# Patient Record
Sex: Male | Born: 1945
Health system: Southern US, Community
[De-identification: ages and names within clinical notes are randomized; demographics above are authoritative.]

## PROBLEM LIST (undated history)

## (undated) DIAGNOSIS — E7800 Pure hypercholesterolemia, unspecified: Secondary | ICD-10-CM

## (undated) DIAGNOSIS — I10 Essential (primary) hypertension: Secondary | ICD-10-CM

## (undated) DIAGNOSIS — J449 Chronic obstructive pulmonary disease, unspecified: Secondary | ICD-10-CM

## (undated) DIAGNOSIS — I509 Heart failure, unspecified: Secondary | ICD-10-CM

## (undated) DIAGNOSIS — E119 Type 2 diabetes mellitus without complications: Secondary | ICD-10-CM

## (undated) HISTORY — PX: SPLENECTOMY: SUR1306

---

## 2000-09-14 ENCOUNTER — Emergency Department (HOSPITAL_COMMUNITY): Admission: EM | Admit: 2000-09-14 | Discharge: 2000-09-14 | Payer: Self-pay | Admitting: Emergency Medicine

## 2000-11-09 ENCOUNTER — Emergency Department (HOSPITAL_COMMUNITY): Admission: EM | Admit: 2000-11-09 | Discharge: 2000-11-09 | Payer: Self-pay | Admitting: Emergency Medicine

## 2000-11-16 ENCOUNTER — Encounter: Payer: Self-pay | Admitting: Urology

## 2000-11-16 ENCOUNTER — Inpatient Hospital Stay (HOSPITAL_COMMUNITY): Admission: AD | Admit: 2000-11-16 | Discharge: 2000-11-21 | Payer: Self-pay | Admitting: Urology

## 2015-12-17 DEATH — deceased

## 2018-05-08 DIAGNOSIS — E7849 Other hyperlipidemia: Secondary | ICD-10-CM | POA: Diagnosis present

## 2018-05-31 ENCOUNTER — Emergency Department (HOSPITAL_COMMUNITY): Payer: Medicare Other

## 2018-05-31 ENCOUNTER — Encounter (HOSPITAL_COMMUNITY): Payer: Self-pay | Admitting: Emergency Medicine

## 2018-05-31 ENCOUNTER — Inpatient Hospital Stay (HOSPITAL_COMMUNITY)
Admission: EM | Admit: 2018-05-31 | Discharge: 2018-06-03 | DRG: 871 | Disposition: A | Discharge status: Home Health | Payer: Medicare Other | Attending: Internal Medicine | Admitting: Internal Medicine

## 2018-05-31 ENCOUNTER — Other Ambulatory Visit: Payer: Self-pay

## 2018-05-31 DIAGNOSIS — Z87891 Personal history of nicotine dependence: Secondary | ICD-10-CM

## 2018-05-31 DIAGNOSIS — E119 Type 2 diabetes mellitus without complications: Secondary | ICD-10-CM | POA: Diagnosis present

## 2018-05-31 DIAGNOSIS — J189 Pneumonia, unspecified organism: Secondary | ICD-10-CM | POA: Diagnosis present

## 2018-05-31 DIAGNOSIS — T25021D Burn of unspecified degree of right foot, subsequent encounter: Secondary | ICD-10-CM

## 2018-05-31 DIAGNOSIS — E669 Obesity, unspecified: Secondary | ICD-10-CM | POA: Diagnosis present

## 2018-05-31 DIAGNOSIS — E1169 Type 2 diabetes mellitus with other specified complication: Secondary | ICD-10-CM | POA: Diagnosis present

## 2018-05-31 DIAGNOSIS — R008 Other abnormalities of heart beat: Secondary | ICD-10-CM | POA: Diagnosis present

## 2018-05-31 DIAGNOSIS — S80212A Abrasion, left knee, initial encounter: Secondary | ICD-10-CM | POA: Diagnosis present

## 2018-05-31 DIAGNOSIS — E1165 Type 2 diabetes mellitus with hyperglycemia: Secondary | ICD-10-CM | POA: Diagnosis present

## 2018-05-31 DIAGNOSIS — Z9081 Acquired absence of spleen: Secondary | ICD-10-CM

## 2018-05-31 DIAGNOSIS — A419 Sepsis, unspecified organism: Principal | ICD-10-CM | POA: Diagnosis present

## 2018-05-31 DIAGNOSIS — E7800 Pure hypercholesterolemia, unspecified: Secondary | ICD-10-CM | POA: Diagnosis present

## 2018-05-31 DIAGNOSIS — T25031D Burn of unspecified degree of right toe(s) (nail), subsequent encounter: Secondary | ICD-10-CM

## 2018-05-31 DIAGNOSIS — S0083XA Contusion of other part of head, initial encounter: Secondary | ICD-10-CM | POA: Diagnosis present

## 2018-05-31 DIAGNOSIS — L8990 Pressure ulcer of unspecified site, unspecified stage: Secondary | ICD-10-CM | POA: Diagnosis present

## 2018-05-31 DIAGNOSIS — Z79899 Other long term (current) drug therapy: Secondary | ICD-10-CM

## 2018-05-31 DIAGNOSIS — S80211A Abrasion, right knee, initial encounter: Secondary | ICD-10-CM | POA: Diagnosis present

## 2018-05-31 DIAGNOSIS — E785 Hyperlipidemia, unspecified: Secondary | ICD-10-CM | POA: Diagnosis present

## 2018-05-31 DIAGNOSIS — X12XXXD Contact with other hot fluids, subsequent encounter: Secondary | ICD-10-CM | POA: Diagnosis present

## 2018-05-31 DIAGNOSIS — Z794 Long term (current) use of insulin: Secondary | ICD-10-CM

## 2018-05-31 DIAGNOSIS — T25022D Burn of unspecified degree of left foot, subsequent encounter: Secondary | ICD-10-CM

## 2018-05-31 DIAGNOSIS — I2510 Atherosclerotic heart disease of native coronary artery without angina pectoris: Secondary | ICD-10-CM | POA: Diagnosis present

## 2018-05-31 DIAGNOSIS — Z6836 Body mass index (BMI) 36.0-36.9, adult: Secondary | ICD-10-CM

## 2018-05-31 DIAGNOSIS — Y92013 Bedroom of single-family (private) house as the place of occurrence of the external cause: Secondary | ICD-10-CM

## 2018-05-31 DIAGNOSIS — Y9389 Activity, other specified: Secondary | ICD-10-CM

## 2018-05-31 DIAGNOSIS — I10 Essential (primary) hypertension: Secondary | ICD-10-CM | POA: Diagnosis present

## 2018-05-31 DIAGNOSIS — Z7982 Long term (current) use of aspirin: Secondary | ICD-10-CM

## 2018-05-31 DIAGNOSIS — W0110XA Fall on same level from slipping, tripping and stumbling with subsequent striking against unspecified object, initial encounter: Secondary | ICD-10-CM | POA: Diagnosis present

## 2018-05-31 HISTORY — DX: Pure hypercholesterolemia, unspecified: E78.00

## 2018-05-31 HISTORY — DX: Essential (primary) hypertension: I10

## 2018-05-31 HISTORY — DX: Type 2 diabetes mellitus without complications: E11.9

## 2018-05-31 LAB — TROPONIN I: Troponin I: <0.03 ng/mL (ref <0.03)

## 2018-05-31 LAB — MAGNESIUM: Magnesium: 1.5 mg/dL — ABNORMAL LOW (ref 1.7–2.4)

## 2018-05-31 LAB — COMPREHENSIVE METABOLIC PANEL
ALT: 15 U/L (ref 0–44)
AST: 16 U/L (ref 15–41)
Albumin: 3.3 g/dL — ABNORMAL LOW (ref 3.5–5.0)
Alkaline Phosphatase: 51 U/L (ref 38–126)
Anion gap: 12 (ref 5–15)
BUN: 10 mg/dL (ref 8–23)
CO2: 23 mmol/L (ref 22–32)
Calcium: 8.6 mg/dL — ABNORMAL LOW (ref 8.9–10.3)
Chloride: 101 mmol/L (ref 98–111)
Creatinine, Ser: 0.85 mg/dL (ref 0.61–1.24)
GFR calc Af Amer: >60 mL/min (ref >60)
GFR calc non Af Amer: >60 mL/min (ref >60)
Glucose, Bld: 188 mg/dL — ABNORMAL HIGH (ref 70–99)
Potassium: 4.3 mmol/L (ref 3.5–5.1)
Sodium: 136 mmol/L (ref 135–145)
Total Bilirubin: 0.9 mg/dL (ref 0.3–1.2)
Total Protein: 5.7 g/dL — ABNORMAL LOW (ref 6.5–8.1)

## 2018-05-31 LAB — CBC WITH DIFFERENTIAL/PLATELET
Abs Immature Granulocytes: 0 10*3/uL (ref 0.00–0.07)
Basophils Absolute: 0.3 10*3/uL — ABNORMAL HIGH (ref 0.0–0.1)
Basophils Relative: 1 %
Eosinophils Absolute: 0.3 10*3/uL (ref 0.0–0.5)
Eosinophils Relative: 1 %
HCT: 46.1 % (ref 39.0–52.0)
Hemoglobin: 15.4 g/dL (ref 13.0–17.0)
Lymphocytes Relative: 2 %
Lymphs Abs: 0.6 10*3/uL — ABNORMAL LOW (ref 0.7–4.0)
MCH: 31 pg (ref 26.0–34.0)
MCHC: 33.4 g/dL (ref 30.0–36.0)
MCV: 92.9 fL (ref 80.0–100.0)
Monocytes Absolute: 2.9 10*3/uL — ABNORMAL HIGH (ref 0.1–1.0)
Monocytes Relative: 10 %
Neutro Abs: 24.5 10*3/uL — ABNORMAL HIGH (ref 1.7–7.7)
Neutrophils Relative %: 86 %
Platelets: 351 10*3/uL (ref 150–400)
RBC: 4.96 MIL/uL (ref 4.22–5.81)
RDW: 13.7 % (ref 11.5–15.5)
WBC: 28.5 10*3/uL — ABNORMAL HIGH (ref 4.0–10.5)
nRBC: 0 % (ref 0.0–0.2)
nRBC: 0 /100 WBC

## 2018-05-31 LAB — TSH: TSH: 0.965 u[IU]/mL (ref 0.350–4.500)

## 2018-05-31 LAB — PHOSPHORUS: Phosphorus: 2.5 mg/dL (ref 2.5–4.6)

## 2018-05-31 LAB — LACTIC ACID, PLASMA: Lactic Acid, Venous: 2.6 mmol/L (ref 0.5–1.9)

## 2018-05-31 MED ORDER — SODIUM CHLORIDE 0.9 % IV BOLUS: 1000.0000 mL | Freq: Once | INTRAVENOUS | Status: DC

## 2018-05-31 MED ORDER — SODIUM CHLORIDE 0.9 % IV SOLN: 2.0000 g | Freq: Two times a day (BID) | INTRAVENOUS | Status: DC

## 2018-05-31 MED ORDER — VANCOMYCIN HCL 10 G IV SOLR
2000.0000 mg | Freq: Once | INTRAVENOUS | Status: AC
  Administered 2018-06-01: 2000 mg via INTRAVENOUS
  Filled 2018-05-31: qty 2000

## 2018-05-31 MED ORDER — SODIUM CHLORIDE 0.9 % IV SOLN: 1.0000 g | Freq: Once | INTRAVENOUS | Status: DC

## 2018-05-31 MED ORDER — VANCOMYCIN HCL 10 G IV SOLR: 1250.0000 mg | Freq: Once | INTRAVENOUS | Status: DC

## 2018-05-31 MED ORDER — SODIUM CHLORIDE 0.9 % IV BOLUS
500.0000 mL | Freq: Once | INTRAVENOUS | Status: AC
  Administered 2018-05-31: 1000 mL via INTRAVENOUS

## 2018-05-31 MED ORDER — SODIUM CHLORIDE 0.9 % IV BOLUS
2000.0000 mL | Freq: Once | INTRAVENOUS | Status: AC
  Administered 2018-05-31: 2000 mL via INTRAVENOUS

## 2018-05-31 MED ORDER — VANCOMYCIN HCL IN DEXTROSE 1-5 GM/200ML-% IV SOLN: 1000.0000 mg | Freq: Two times a day (BID) | INTRAVENOUS | Status: DC

## 2018-05-31 MED ORDER — SODIUM CHLORIDE 0.9 % IV SOLN
2.0000 g | Freq: Once | INTRAVENOUS | Status: AC
  Administered 2018-05-31: 2 g via INTRAVENOUS
  Filled 2018-05-31: qty 2

## 2018-05-31 NOTE — ED Provider Notes (Signed)
[facility] EMERGENCY DEPARTMENT Provider Note   CSN: [id number] Arrival date & time:        History   Chief Complaint Chief Complaint  Patient presents with  . Fall    HPI Jordan Wells is a 73 y.o. male.  HPI Patient is a 73 year old male with a history of hypertension, hyperlipidemia, diabetes who presents the emergency department with hypotension and generalized weakness which presented today.  He states he had been feeling somewhat poorly with nonspecific symptoms and took a nap and when he woke up from his nap he stood up and got out of the bed, developed lightheadedness and then fell forward and struck his head.  Family states the patient was too weak to get up off the floor with generalized weakness and looked pale and thus EMS was contacted.  On EMS arrival the patient was hypotensive looked pale and was in bigeminy.  No precordial chest pain or chest tightness described by the patient to EMS.  Patient given 500 cc of fluid in route with improvement in his blood pressure and improvement in his color.  He states he feels generally weak at this time.  Denies significant abdominal pain.  Reports cough.  No recent sick contacts.  Denies fevers over the past 24 hours.   Past Medical History:  Diagnosis Date  . Diabetes mellitus without complication ([place])   . High cholesterol   . Hypertension     There are no active problems to display for this patient.   Past Surgical History:  Procedure Laterality Date  . SPLENECTOMY          Home Medications    Prior to Admission medications   Medication Sig Start Date End Date Taking? Authorizing Provider  aspirin 325 MG tablet Take 325 mg by mouth daily.   Yes [provider]  atorvastatin (LIPITOR) 20 MG tablet Take 0.5 tablets by mouth 2 (two) times daily. 02/12/10  Yes [provider]  insulin glargine (LANTUS) 100 UNIT/ML injection Inject 30 Units into the skin 2 (two) times daily.   Yes  [provider]  lisinopril (PRINIVIL,ZESTRIL) 20 MG tablet Take 1 tablet by mouth daily. 05/10/18  Yes [provider]  metFORMIN (GLUCOPHAGE) 500 MG tablet Take 1 tablet by mouth 2 (two) times daily. 02/12/10  Yes [provider]    Family History No family history on file.  Social History Social History   Tobacco Use  . Smoking status: Never Smoker  . Smokeless tobacco: Never Used  Substance Use Topics  . Alcohol use: Never    Frequency: Never  . Drug use: Never     Allergies   Patient has no known allergies.   Review of Systems Review of Systems  All other systems reviewed and are negative.    Physical Exam Updated Vital Signs BP 92/63   Pulse 81   Temp 97.7 F (36.5 C) (Tympanic)   Resp (!) 21   Ht 5\' 10"  (1.778 m)   Wt 115.2 kg   SpO2 90%   BMI 36.45 kg/m   Physical Exam Vitals signs and nursing note reviewed.  Constitutional:      Appearance: He is well-developed.  HENT:     Head: Normocephalic and atraumatic.  Neck:     Musculoskeletal: Normal range of motion.  Cardiovascular:     Rate and Rhythm: Normal rate and regular rhythm.     Heart sounds: Normal heart sounds.  Pulmonary:     Effort:  Pulmonary effort is normal. No respiratory distress.     Breath sounds: Normal breath sounds.  Abdominal:     General: There is no distension.     Palpations: Abdomen is soft.     Tenderness: There is no abdominal tenderness.  Musculoskeletal: Normal range of motion.  Skin:    General: Skin is warm and dry.  Neurological:     Mental Status: He is alert and oriented to person, place, and time.  Psychiatric:        Judgment: Judgment normal.      ED Treatments / Results  Labs (all labs ordered are listed, but only abnormal results are displayed) Labs Reviewed  CBC WITH DIFFERENTIAL/PLATELET - Abnormal; Notable for the following components:      Result Value   WBC 28.5 (*)    Neutro Abs 24.5 (*)    Lymphs Abs 0.6 (*)      Monocytes Absolute 2.9 (*)    Basophils Absolute 0.3 (*)    All other components within normal limits  COMPREHENSIVE METABOLIC PANEL - Abnormal; Notable for the following components:   Glucose, Bld 188 (*)    Calcium 8.6 (*)    Total Protein 5.7 (*)    Albumin 3.3 (*)    All other components within normal limits  MAGNESIUM - Abnormal; Notable for the following components:   Magnesium 1.5 (*)    All other components within normal limits  LACTIC ACID, PLASMA - Abnormal; Notable for the following components:   Lactic Acid, Venous 2.6 (*)    All other components within normal limits  TROPONIN I  PHOSPHORUS  TSH  URINALYSIS, ROUTINE W REFLEX MICROSCOPIC  LACTIC ACID, PLASMA    EKG EKG Interpretation  Date/Time:  Friday May 31 2018 18:57:00 EST Ventricular Rate:  103 PR Interval:    QRS Duration: 103 QT Interval:  365 QTC Calculation: 462 R Axis:   56 Text Interpretation:  Sinus tachycardia Multiple ventricular premature complexes Low voltage, precordial leads Borderline T wave abnormalities No significant change was found Confirmed by Jola Schmidt [phone]) on 05/31/2018 11:47:12 PM   Radiology Ct Head Wo Contrast  Result Date: 05/31/2018 CLINICAL DATA:  73 y/o M; fall with head injury. Hematoma to the mid forehead. EXAM: CT HEAD WITHOUT CONTRAST TECHNIQUE: Contiguous axial images were obtained from the base of the skull through the vertex without intravenous contrast. COMPARISON:  None. FINDINGS: Brain: No evidence of acute infarction, hemorrhage, hydrocephalus, extra-axial collection or mass lesion/mass effect. Mild chronic microvascular ischemic changes and volume loss of the brain for age. Vascular: Calcific atherosclerosis of carotid siphons and vertebral arteries. No hyperdense vessel identified. Skull: Mild frontal scalp contusion. No calvarial fracture. Sinuses/Orbits: Moderate mucosal thickening of the ethmoid air cells and the maxillary sinuses. Normal aeration of the  mastoid air cells. Bilateral intra-ocular lens replacement. Other: None. IMPRESSION: 1. Mild frontal scalp contusion. No calvarial fracture. 2. No acute intracranial abnormality. 3. Mild chronic microvascular ischemic changes and volume loss of the brain for age. 4. Moderate paranasal sinus disease. Electronically Signed   By: Kristine Garbe M.D.   On: 05/31/2018 20:21   Dg Chest Portable 1 View  Result Date: 05/31/2018 CLINICAL DATA:  73 y/o  M; weakness and fever. EXAM: PORTABLE CHEST 1 VIEW COMPARISON:  05/07/2018 chest radiograph. FINDINGS: Stable normal cardiac silhouette given projection and technique. Ill-defined opacity at the left lung base. No pleural effusion or pneumothorax. Degenerative changes of the thoracic spine. No acute osseous abnormality identified. IMPRESSION: Ill-defined  opacity at the left lung base may represent atelectasis or pneumonia. Electronically Signed   By: Kristine Garbe M.D.   On: 05/31/2018 22:53    Procedures .Critical Care Performed by: Jola Schmidt, MD Authorized by: Jola Schmidt, MD   Critical care provider statement:    Critical care time (minutes):  45   Critical care was time spent personally by me on the following activities:  Discussions with consultants, evaluation of patient's response to treatment, examination of patient, ordering and performing treatments and interventions, ordering and review of laboratory studies, ordering and review of radiographic studies, pulse oximetry, re-evaluation of patient's condition, obtaining history from patient or surrogate and review of old charts   (including critical care time)  Medications Ordered in ED Medications  vancomycin (VANCOCIN) 2,000 mg in sodium chloride 0.9 % 500 mL IVPB (has no administration in time range)  ceFEPIme (MAXIPIME) 2 g in sodium chloride 0.9 % 100 mL IVPB (has no administration in time range)  ceFEPIme (MAXIPIME) 2 g in sodium chloride 0.9 % 100 mL IVPB (has no  administration in time range)  vancomycin (VANCOCIN) IVPB 1000 mg/200 mL premix (has no administration in time range)  sodium chloride 0.9 % bolus 2,000 mL (2,000 mLs Intravenous New Bag/Given 05/31/18 2339)  sodium chloride 0.9 % bolus 500 mL (0 mLs Intravenous Stopped 05/31/18 2339)     Initial Impression / Assessment and Plan / ED Course  I have reviewed the triage vital signs and the nursing notes.  Pertinent labs & imaging results that were available during my care of the patient were reviewed by me and considered in my medical decision making (see chart for details).     Patient improving with IV fluids.  White blood cell count of 28,000 noted.  Chest x-ray concerning for developing pneumonia.  Blood pressure improving with IV fluids.  Repeat lactate pending.  Blood cultures obtained.  Urinalysis and urine culture.  Family updated.  Patient will be admitted to hospital.  Broad-spectrum antibiotics.  Suspect healthcare associated pneumonia given recent hospitalization.  Final Clinical Impressions(s) / ED Diagnoses   Final diagnoses:  Sepsis, due to unspecified organism, unspecified whether acute organ dysfunction present [facility])    ED Discharge Orders    None       Jola Schmidt, MD 05/31/18 2348

## 2018-05-31 NOTE — ED Notes (Signed)
Pt returned from CT °

## 2018-05-31 NOTE — ED Triage Notes (Signed)
Pt to ED from home via [facility] after reported tripping and falling hitting his head on a door knob.  Pt denies LOC, hematoma noted to mid forehead

## 2018-05-31 NOTE — ED Notes (Signed)
Pt resting with eyes closed at this time.  No complaints voiced. 

## 2018-05-31 NOTE — ED Notes (Signed)
Pt to CT at this time.

## 2018-05-31 NOTE — Progress Notes (Signed)
Pharmacy Antibiotic Note  Jordan Wells is a 73 y.o. male admitted on 05/31/2018 with sepsis.  Pharmacy has been consulted for vancomycin and cefepime dosing.  Plan: Start cefepime 2g IV Q12h Give vancomycin 2g IV x 1, then start vancomycin 1g IV Q12h Monitor clinical picture, renal function, vanc levels prn F/U C&S, abx deescalation / LOT  Height: 5\' 10"  (177.8 cm) Weight: 254 lb (115.2 kg) IBW/kg (Calculated) : 73  Temp (24hrs), Avg:97.7 F (36.5 C), Min:97.7 F (36.5 C), Max:97.7 F (36.5 C)  Recent Labs  Lab 05/31/18 1927  WBC 28.5*  CREATININE 0.85  LATICACIDVEN 2.6*    Estimated Creatinine Clearance: 99.9 mL/min (by C-G formula based on SCr of 0.85 mg/dL).    No Known Allergies  Thank you for allowing pharmacy to be a part of this patient's care.  Reginia Naas 05/31/2018 10:34 PM

## 2018-05-31 NOTE — ED Notes (Signed)
Dr. Venora Maples made aware of elevated Lactic Acid

## 2018-06-01 ENCOUNTER — Other Ambulatory Visit: Payer: Self-pay

## 2018-06-01 ENCOUNTER — Encounter (HOSPITAL_COMMUNITY): Payer: Self-pay | Admitting: Internal Medicine

## 2018-06-01 DIAGNOSIS — E119 Type 2 diabetes mellitus without complications: Secondary | ICD-10-CM | POA: Diagnosis present

## 2018-06-01 DIAGNOSIS — S80211A Abrasion, right knee, initial encounter: Secondary | ICD-10-CM | POA: Diagnosis present

## 2018-06-01 DIAGNOSIS — R008 Other abnormalities of heart beat: Secondary | ICD-10-CM | POA: Diagnosis present

## 2018-06-01 DIAGNOSIS — E1165 Type 2 diabetes mellitus with hyperglycemia: Secondary | ICD-10-CM | POA: Diagnosis present

## 2018-06-01 DIAGNOSIS — E1169 Type 2 diabetes mellitus with other specified complication: Secondary | ICD-10-CM

## 2018-06-01 DIAGNOSIS — T25022D Burn of unspecified degree of left foot, subsequent encounter: Secondary | ICD-10-CM | POA: Diagnosis not present

## 2018-06-01 DIAGNOSIS — Z794 Long term (current) use of insulin: Secondary | ICD-10-CM | POA: Diagnosis not present

## 2018-06-01 DIAGNOSIS — I10 Essential (primary) hypertension: Secondary | ICD-10-CM | POA: Diagnosis present

## 2018-06-01 DIAGNOSIS — Z79899 Other long term (current) drug therapy: Secondary | ICD-10-CM | POA: Diagnosis not present

## 2018-06-01 DIAGNOSIS — J189 Pneumonia, unspecified organism: Secondary | ICD-10-CM | POA: Diagnosis present

## 2018-06-01 DIAGNOSIS — Z7982 Long term (current) use of aspirin: Secondary | ICD-10-CM | POA: Diagnosis not present

## 2018-06-01 DIAGNOSIS — T25031D Burn of unspecified degree of right toe(s) (nail), subsequent encounter: Secondary | ICD-10-CM | POA: Diagnosis not present

## 2018-06-01 DIAGNOSIS — X12XXXD Contact with other hot fluids, subsequent encounter: Secondary | ICD-10-CM | POA: Diagnosis present

## 2018-06-01 DIAGNOSIS — A419 Sepsis, unspecified organism: Principal | ICD-10-CM

## 2018-06-01 DIAGNOSIS — I2510 Atherosclerotic heart disease of native coronary artery without angina pectoris: Secondary | ICD-10-CM | POA: Diagnosis present

## 2018-06-01 DIAGNOSIS — R6520 Severe sepsis without septic shock: Secondary | ICD-10-CM

## 2018-06-01 DIAGNOSIS — E669 Obesity, unspecified: Secondary | ICD-10-CM

## 2018-06-01 DIAGNOSIS — S0083XA Contusion of other part of head, initial encounter: Secondary | ICD-10-CM | POA: Diagnosis present

## 2018-06-01 DIAGNOSIS — E7800 Pure hypercholesterolemia, unspecified: Secondary | ICD-10-CM | POA: Diagnosis present

## 2018-06-01 DIAGNOSIS — E785 Hyperlipidemia, unspecified: Secondary | ICD-10-CM | POA: Diagnosis present

## 2018-06-01 DIAGNOSIS — Y92013 Bedroom of single-family (private) house as the place of occurrence of the external cause: Secondary | ICD-10-CM | POA: Diagnosis not present

## 2018-06-01 DIAGNOSIS — W0110XA Fall on same level from slipping, tripping and stumbling with subsequent striking against unspecified object, initial encounter: Secondary | ICD-10-CM | POA: Diagnosis present

## 2018-06-01 DIAGNOSIS — Y9389 Activity, other specified: Secondary | ICD-10-CM | POA: Diagnosis not present

## 2018-06-01 DIAGNOSIS — Z6836 Body mass index (BMI) 36.0-36.9, adult: Secondary | ICD-10-CM | POA: Diagnosis not present

## 2018-06-01 DIAGNOSIS — Z9081 Acquired absence of spleen: Secondary | ICD-10-CM | POA: Diagnosis not present

## 2018-06-01 DIAGNOSIS — T25021D Burn of unspecified degree of right foot, subsequent encounter: Secondary | ICD-10-CM | POA: Diagnosis not present

## 2018-06-01 DIAGNOSIS — Z87891 Personal history of nicotine dependence: Secondary | ICD-10-CM | POA: Diagnosis not present

## 2018-06-01 DIAGNOSIS — S80212A Abrasion, left knee, initial encounter: Secondary | ICD-10-CM | POA: Diagnosis present

## 2018-06-01 LAB — GLUCOSE, CAPILLARY
Glucose-Capillary: 163 mg/dL — ABNORMAL HIGH (ref 70–99)
Glucose-Capillary: 160 mg/dL — ABNORMAL HIGH (ref 70–99)
Glucose-Capillary: 139 mg/dL — ABNORMAL HIGH (ref 70–99)
Glucose-Capillary: 141 mg/dL — ABNORMAL HIGH (ref 70–99)
Glucose-Capillary: 142 mg/dL — ABNORMAL HIGH (ref 70–99)

## 2018-06-01 LAB — COMPREHENSIVE METABOLIC PANEL
ALT: 14 U/L (ref 0–44)
ANION GAP: 10 (ref 5–15)
AST: 16 U/L (ref 15–41)
Albumin: 3.1 g/dL — ABNORMAL LOW (ref 3.5–5.0)
Alkaline Phosphatase: 52 U/L (ref 38–126)
BUN: 10 mg/dL (ref 8–23)
CO2: 21 mmol/L — ABNORMAL LOW (ref 22–32)
Calcium: 8.1 mg/dL — ABNORMAL LOW (ref 8.9–10.3)
Chloride: 105 mmol/L (ref 98–111)
Creatinine, Ser: 0.88 mg/dL (ref 0.61–1.24)
GFR calc Af Amer: >60 mL/min (ref >60)
GFR calc non Af Amer: >60 mL/min (ref >60)
Glucose, Bld: 152 mg/dL — ABNORMAL HIGH (ref 70–99)
POTASSIUM: 4 mmol/L (ref 3.5–5.1)
Sodium: 136 mmol/L (ref 135–145)
Total Bilirubin: 1.1 mg/dL (ref 0.3–1.2)
Total Protein: 5.6 g/dL — ABNORMAL LOW (ref 6.5–8.1)

## 2018-06-01 LAB — CBC WITH DIFFERENTIAL/PLATELET
Abs Immature Granulocytes: 0.19 10*3/uL — ABNORMAL HIGH (ref 0.00–0.07)
Basophils Absolute: 0.1 10*3/uL (ref 0.0–0.1)
Basophils Relative: 0 %
EOS ABS: 0 10*3/uL (ref 0.0–0.5)
Eosinophils Relative: 0 %
HCT: 43.9 % (ref 39.0–52.0)
Hemoglobin: 15.1 g/dL (ref 13.0–17.0)
Immature Granulocytes: 1 %
Lymphocytes Relative: 5 %
Lymphs Abs: 1.5 10*3/uL (ref 0.7–4.0)
MCH: 31.7 pg (ref 26.0–34.0)
MCHC: 34.4 g/dL (ref 30.0–36.0)
MCV: 92.2 fL (ref 80.0–100.0)
Monocytes Absolute: 3.1 10*3/uL — ABNORMAL HIGH (ref 0.1–1.0)
Monocytes Relative: 11 %
NEUTROS PCT: 83 %
Neutro Abs: 23 10*3/uL — ABNORMAL HIGH (ref 1.7–7.7)
Platelets: 341 10*3/uL (ref 150–400)
RBC: 4.76 MIL/uL (ref 4.22–5.81)
RDW: 13.8 % (ref 11.5–15.5)
WBC: 27.9 10*3/uL — AB (ref 4.0–10.5)
nRBC: 0 % (ref 0.0–0.2)

## 2018-06-01 LAB — URINALYSIS, ROUTINE W REFLEX MICROSCOPIC
Bilirubin Urine: NEGATIVE
Glucose, UA: NEGATIVE mg/dL
Ketones, ur: NEGATIVE mg/dL
Nitrite: NEGATIVE
Protein, ur: NEGATIVE mg/dL
Specific Gravity, Urine: 1.023 (ref 1.005–1.030)
pH: 5 (ref 5.0–8.0)

## 2018-06-01 LAB — INFLUENZA PANEL BY PCR (TYPE A & B)
INFLAPCR: NEGATIVE
Influenza B By PCR: NEGATIVE

## 2018-06-01 LAB — TROPONIN I: Troponin I: <0.03 ng/mL (ref <0.03)

## 2018-06-01 LAB — STREP PNEUMONIAE URINARY ANTIGEN: Strep Pneumo Urinary Antigen: NEGATIVE

## 2018-06-01 LAB — MAGNESIUM: Magnesium: 1.4 mg/dL — ABNORMAL LOW (ref 1.7–2.4)

## 2018-06-01 LAB — MRSA PCR SCREENING: MRSA by PCR: NEGATIVE

## 2018-06-01 LAB — LACTIC ACID, PLASMA: Lactic Acid, Venous: 1.5 mmol/L (ref 0.5–1.9)

## 2018-06-01 LAB — CK: Total CK: 129 U/L (ref 49–397)

## 2018-06-01 MED ORDER — ONDANSETRON HCL 4 MG PO TABS: 4.0000 mg | ORAL_TABLET | Freq: Four times a day (QID) | ORAL | Status: DC | PRN

## 2018-06-01 MED ORDER — SODIUM CHLORIDE 0.9 % IV SOLN
INTRAVENOUS | Status: DC
  Administered 2018-06-01: 05:00:00 via INTRAVENOUS

## 2018-06-01 MED ORDER — ATORVASTATIN CALCIUM 10 MG PO TABS
10.0000 mg | ORAL_TABLET | Freq: Two times a day (BID) | ORAL | Status: DC
  Administered 2018-06-01 – 2018-06-03 (×5): 10 mg via ORAL
  Filled 2018-06-01 (×5): qty 1

## 2018-06-01 MED ORDER — BENZONATATE 100 MG PO CAPS: 100.0000 mg | ORAL_CAPSULE | Freq: Two times a day (BID) | ORAL | Status: DC | PRN

## 2018-06-01 MED ORDER — HYDRALAZINE HCL 20 MG/ML IJ SOLN: 5.0000 mg | INTRAMUSCULAR | Status: DC | PRN

## 2018-06-01 MED ORDER — SODIUM CHLORIDE 0.9 % IV SOLN
1.0000 g | INTRAVENOUS | Status: DC
  Administered 2018-06-01 – 2018-06-03 (×3): 1 g via INTRAVENOUS
  Filled 2018-06-01 (×3): qty 10

## 2018-06-01 MED ORDER — ASPIRIN 325 MG PO TABS
325.0000 mg | ORAL_TABLET | Freq: Every day | ORAL | Status: DC
  Administered 2018-06-01 – 2018-06-03 (×3): 325 mg via ORAL
  Filled 2018-06-01 (×3): qty 1

## 2018-06-01 MED ORDER — INSULIN GLARGINE 100 UNIT/ML ~~LOC~~ SOLN
30.0000 [IU] | Freq: Two times a day (BID) | SUBCUTANEOUS | Status: DC
  Administered 2018-06-01 – 2018-06-03 (×4): 30 [IU] via SUBCUTANEOUS
  Filled 2018-06-01 (×7): qty 0.3

## 2018-06-01 MED ORDER — SODIUM CHLORIDE 0.9 % IV SOLN: INTRAVENOUS | Status: DC

## 2018-06-01 MED ORDER — ACETAMINOPHEN 325 MG PO TABS: 650.0000 mg | ORAL_TABLET | Freq: Four times a day (QID) | ORAL | Status: DC | PRN

## 2018-06-01 MED ORDER — ENOXAPARIN SODIUM 40 MG/0.4ML ~~LOC~~ SOLN
40.0000 mg | SUBCUTANEOUS | Status: DC
  Administered 2018-06-01: 40 mg via SUBCUTANEOUS
  Filled 2018-06-01: qty 0.4

## 2018-06-01 MED ORDER — MAGNESIUM SULFATE 4 GM/100ML IV SOLN
4.0000 g | Freq: Once | INTRAVENOUS | Status: AC
  Administered 2018-06-01: 4 g via INTRAVENOUS
  Filled 2018-06-01: qty 100

## 2018-06-01 MED ORDER — ONDANSETRON HCL 4 MG/2ML IJ SOLN: 4.0000 mg | Freq: Four times a day (QID) | INTRAMUSCULAR | Status: DC | PRN

## 2018-06-01 MED ORDER — ACETAMINOPHEN 325 MG PO TABS
650.0000 mg | ORAL_TABLET | Freq: Four times a day (QID) | ORAL | Status: DC | PRN
  Administered 2018-06-01: 650 mg via ORAL
  Filled 2018-06-01: qty 2

## 2018-06-01 MED ORDER — ACETAMINOPHEN 650 MG RE SUPP: 650.0000 mg | Freq: Four times a day (QID) | RECTAL | Status: DC | PRN

## 2018-06-01 MED ORDER — INSULIN ASPART 100 UNIT/ML ~~LOC~~ SOLN
0.0000 [IU] | Freq: Three times a day (TID) | SUBCUTANEOUS | Status: DC
  Administered 2018-06-01: 1 [IU] via SUBCUTANEOUS
  Administered 2018-06-01: 2 [IU] via SUBCUTANEOUS
  Administered 2018-06-01: 1 [IU] via SUBCUTANEOUS
  Administered 2018-06-02 – 2018-06-03 (×2): 2 [IU] via SUBCUTANEOUS

## 2018-06-01 MED ORDER — SODIUM CHLORIDE 0.9 % IV SOLN
500.0000 mg | INTRAVENOUS | Status: AC
  Administered 2018-06-01 – 2018-06-02 (×2): 500 mg via INTRAVENOUS
  Filled 2018-06-01 (×2): qty 500
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
## 2018-06-01 NOTE — ED Notes (Signed)
Attempted report 
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
## 2018-06-01 NOTE — Progress Notes (Signed)
Pt's oldest son Jordan Wells expressed concerns for his dad's ability to take care of himself. Pt's son Jordan Wells states his brother who lives at home is no help to his dad. Son requests to be called about discharge and if therapy is needed. Updated son and pt that PT and OT will come and evaluate pt to assess needs and make recommendations.
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
## 2018-06-02 DIAGNOSIS — J181 Lobar pneumonia, unspecified organism: Secondary | ICD-10-CM

## 2018-06-02 LAB — COMPREHENSIVE METABOLIC PANEL
ALT: 15 U/L (ref 0–44)
ANION GAP: 10 (ref 5–15)
AST: 21 U/L (ref 15–41)
Albumin: 2.8 g/dL — ABNORMAL LOW (ref 3.5–5.0)
Alkaline Phosphatase: 63 U/L (ref 38–126)
BUN: 9 mg/dL (ref 8–23)
CO2: 22 mmol/L (ref 22–32)
Calcium: 7.9 mg/dL — ABNORMAL LOW (ref 8.9–10.3)
Chloride: 101 mmol/L (ref 98–111)
Creatinine, Ser: 0.77 mg/dL (ref 0.61–1.24)
GFR calc non Af Amer: >60 mL/min (ref >60)
Glucose, Bld: 121 mg/dL — ABNORMAL HIGH (ref 70–99)
Potassium: 3.9 mmol/L (ref 3.5–5.1)
Sodium: 133 mmol/L — ABNORMAL LOW (ref 135–145)
Total Bilirubin: 1 mg/dL (ref 0.3–1.2)
Total Protein: 5.6 g/dL — ABNORMAL LOW (ref 6.5–8.1)

## 2018-06-02 LAB — GLUCOSE, CAPILLARY
Glucose-Capillary: 108 mg/dL — ABNORMAL HIGH (ref 70–99)
Glucose-Capillary: 182 mg/dL — ABNORMAL HIGH (ref 70–99)
Glucose-Capillary: 123 mg/dL — ABNORMAL HIGH (ref 70–99)
Glucose-Capillary: 164 mg/dL — ABNORMAL HIGH (ref 70–99)

## 2018-06-02 LAB — HIV ANTIBODY (ROUTINE TESTING W REFLEX): HIV Screen 4th Generation wRfx: NONREACTIVE

## 2018-06-02 LAB — CBC
HCT: 42.9 % (ref 39.0–52.0)
HEMOGLOBIN: 13.9 g/dL (ref 13.0–17.0)
MCH: 29.8 pg (ref 26.0–34.0)
MCHC: 32.4 g/dL (ref 30.0–36.0)
MCV: 92.1 fL (ref 80.0–100.0)
Platelets: 292 10*3/uL (ref 150–400)
RBC: 4.66 MIL/uL (ref 4.22–5.81)
RDW: 13.9 % (ref 11.5–15.5)
WBC: 25.9 10*3/uL — ABNORMAL HIGH (ref 4.0–10.5)
nRBC: 0 % (ref 0.0–0.2)

## 2018-06-02 LAB — LEGIONELLA PNEUMOPHILA SEROGP 1 UR AG: L. pneumophila Serogp 1 Ur Ag: NEGATIVE

## 2018-06-02 LAB — MAGNESIUM: Magnesium: 2.1 mg/dL (ref 1.7–2.4)

## 2018-06-02 MED ORDER — AZITHROMYCIN 500 MG PO TABS
250.0000 mg | ORAL_TABLET | Freq: Every day | ORAL | Status: DC
  Administered 2018-06-03: 250 mg via ORAL
  Filled 2018-06-02: qty 1

## 2018-06-02 MED ORDER — LISINOPRIL 20 MG PO TABS
20.0000 mg | ORAL_TABLET | Freq: Every day | ORAL | Status: DC
  Administered 2018-06-02 – 2018-06-03 (×2): 20 mg via ORAL
  Filled 2018-06-02 (×2): qty 1
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
## 2018-06-02 NOTE — Evaluation (Signed)
Physical Therapy Evaluation Patient Details Name: Jordan Wells MRN: [id number] DOB: 03/24/1946 Today's Date: 06/02/2018   History of Present Illness  Pt is a 73 y.o. M with significant PMH of DM, tobacco abuse, who presents after a mechanical fall out of bed at home for which he was unable to stand up.   Clinical Impression  Pt admitted with above. On PT evaluation, pt ambulating 100 ft x 2 with no assistive device (required one standing rest break). HR peak at 129 bpm. Presents with decreased endurance, sensation in feet, and mild balance deficits. In setting of recent fall, recommending HHPT at discharge for home safety evaluation and to maximize functional independence.     Follow Up Recommendations Home health PT    Equipment Recommendations  None recommended by PT    Recommendations for Other Services       Precautions / Restrictions Precautions Precautions: Fall Restrictions Weight Bearing Restrictions: No      Mobility  Bed Mobility Overal bed mobility: Modified Independent             General bed mobility comments: increased time  Transfers Overall transfer level: Needs assistance Equipment used: None Transfers: Sit to/from Stand Sit to Stand: Min guard            Ambulation/Gait Ambulation/Gait assistance: Min guard Gait Distance (Feet): 100 Feet(x2) Assistive device: None Gait Pattern/deviations: Step-through pattern;Decreased dorsiflexion - right;Decreased dorsiflexion - left Gait velocity: decr   General Gait Details: Pt with decreased bilateral heel strike at initial contact  Stairs            Wheelchair Mobility    Modified Rankin (Stroke Patients Only)       Balance Overall balance assessment: Mild deficits observed, not formally tested                                           Pertinent Vitals/Pain Pain Assessment: Faces Faces Pain Scale: Hurts a little bit Pain Location: shoulders Pain Descriptors /  Indicators: Aching Pain Intervention(s): Monitored during session    Home Living Family/patient expects to be discharged to:: Private residence Living Arrangements: Children(son)   Type of Home: House Home Access: Stairs to enter   Technical brewer of Steps: 3 Home Layout: Able to live on main level with bedroom/bathroom Home Equipment: None      Prior Function Level of Independence: Independent         Comments: reports 1 fall in last year     Hand Dominance        Extremity/Trunk Assessment   Upper Extremity Assessment Upper Extremity Assessment: Overall WFL for tasks assessed    Lower Extremity Assessment Lower Extremity Assessment: RLE deficits/detail;LLE deficits/detail RLE Deficits / Details: bilateral foot wounds RLE Sensation: history of peripheral neuropathy LLE Deficits / Details: bilateral foot wounds LLE Sensation: history of peripheral neuropathy       Communication   Communication: No difficulties  Cognition Arousal/Alertness: Awake/alert Behavior During Therapy: WFL for tasks assessed/performed Overall Cognitive Status: Within Functional Limits for tasks assessed                                        General Comments      Exercises     Assessment/Plan    PT Assessment Patient needs continued PT  services  PT Problem List Decreased strength;Decreased activity tolerance;Decreased balance;Decreased mobility       PT Treatment Interventions DME instruction;Gait training;Stair training;Functional mobility training;Therapeutic activities;Therapeutic exercise;Balance training;Patient/family education    PT Goals (Current goals can be found in the Care Plan section)  Acute Rehab PT Goals Patient Stated Goal: pt stating he would like to get out of bed more PT Goal Formulation: With patient Time For Goal Achievement: 06/16/18 Potential to Achieve Goals: Good    Frequency Min 3X/week   Barriers to discharge         Co-evaluation               AM-PAC PT "6 Clicks" Mobility  Outcome Measure Help needed turning from your back to your side while in a flat bed without using bedrails?: None Help needed moving from lying on your back to sitting on the side of a flat bed without using bedrails?: None Help needed moving to and from a bed to a chair (including a wheelchair)?: A Little Help needed standing up from a chair using your arms (e.g., wheelchair or bedside chair)?: A Little Help needed to walk in hospital room?: A Little Help needed climbing 3-5 steps with a railing? : A Lot 6 Click Score: 19    End of Session Equipment Utilized During Treatment: Gait belt Activity Tolerance: Patient tolerated treatment well Patient left: in chair;with call bell/phone within reach;with chair alarm set;with nursing/sitter in room Nurse Communication: Mobility status PT Visit Diagnosis: History of falling (Z91.81);Difficulty in walking, not elsewhere classified (R26.2)    Time: 6010-9323 PT Time Calculation (min) (ACUTE ONLY): 23 min   Charges:   PT Evaluation $PT Eval Moderate Complexity: 1 Mod PT Treatments $Therapeutic Activity: 8-22 mins       Ellamae Sia, PT, DPT Acute Rehabilitation Services Pager [phone] Office [phone]   Willy Eddy 06/02/2018, 10:17 AM

## 2018-06-03 DIAGNOSIS — L8990 Pressure ulcer of unspecified site, unspecified stage: Secondary | ICD-10-CM | POA: Diagnosis present

## 2018-06-03 LAB — BASIC METABOLIC PANEL
Anion gap: 9 (ref 5–15)
BUN: 10 mg/dL (ref 8–23)
CO2: 23 mmol/L (ref 22–32)
CREATININE: 0.7 mg/dL (ref 0.61–1.24)
Calcium: 7.9 mg/dL — ABNORMAL LOW (ref 8.9–10.3)
Chloride: 104 mmol/L (ref 98–111)
GFR calc Af Amer: >60 mL/min (ref >60)
GFR calc non Af Amer: >60 mL/min (ref >60)
Glucose, Bld: 138 mg/dL — ABNORMAL HIGH (ref 70–99)
Potassium: 4.1 mmol/L (ref 3.5–5.1)
Sodium: 136 mmol/L (ref 135–145)

## 2018-06-03 LAB — GLUCOSE, CAPILLARY
Glucose-Capillary: 112 mg/dL — ABNORMAL HIGH (ref 70–99)
Glucose-Capillary: 156 mg/dL — ABNORMAL HIGH (ref 70–99)

## 2018-06-03 LAB — CBC
HCT: 43.3 % (ref 39.0–52.0)
Hemoglobin: 14 g/dL (ref 13.0–17.0)
MCH: 30.5 pg (ref 26.0–34.0)
MCHC: 32.3 g/dL (ref 30.0–36.0)
MCV: 94.3 fL (ref 80.0–100.0)
Platelets: 266 10*3/uL (ref 150–400)
RBC: 4.59 MIL/uL (ref 4.22–5.81)
RDW: 13.9 % (ref 11.5–15.5)
WBC: 15.8 10*3/uL — ABNORMAL HIGH (ref 4.0–10.5)
nRBC: 0 % (ref 0.0–0.2)

## 2018-06-03 MED ORDER — AZITHROMYCIN 250 MG PO TABS: 250.0000 mg | ORAL_TABLET | Freq: Every day | ORAL | 0 refills | Status: DC

## 2018-06-03 MED ORDER — CEFDINIR 300 MG PO CAPS: 300.0000 mg | ORAL_CAPSULE | Freq: Two times a day (BID) | ORAL | 0 refills | Status: AC

## 2018-06-03 MED ORDER — ACETAMINOPHEN 325 MG PO TABS: 650.0000 mg | ORAL_TABLET | Freq: Four times a day (QID) | ORAL | Status: DC | PRN

## 2018-06-03 MED ORDER — SILVER SULFADIAZINE 1 % EX CREA
TOPICAL_CREAM | Freq: Two times a day (BID) | CUTANEOUS | Status: DC
  Administered 2018-06-03: 11:00:00 via TOPICAL
  Filled 2018-06-03: qty 85

## 2018-06-03 MED ORDER — CEFDINIR 300 MG PO CAPS
300.0000 mg | ORAL_CAPSULE | Freq: Two times a day (BID) | ORAL | Status: DC
  Filled 2018-06-03: qty 1

## 2018-06-03 MED ORDER — SILVER SULFADIAZINE 1 % EX CREA: TOPICAL_CREAM | Freq: Two times a day (BID) | CUTANEOUS | 0 refills | Status: DC

## 2018-06-03 MED FILL — AZITHROMYCIN 250 MG TABLET: 250 | 2 days supply | Qty: 2 | Fill #0

## 2018-06-03 MED FILL — CEFDINIR 300 MG CAPSULE: 300 | 4 days supply | Qty: 8 | Fill #0

## 2018-06-03 NOTE — Discharge Instructions (Signed)
Community-Acquired Pneumonia, Adult  Pneumonia is an infection of the lungs. It causes swelling in the airways of the lungs. Mucus and fluid may also build up inside the airways.  One type of pneumonia can happen while a person is in a hospital. A different type can happen when a person is not in a hospital (community-acquired pneumonia).   What are the causes?    This condition is caused by germs (viruses, bacteria, or fungi). Some types of germs can be passed from one person to another. This can happen when you breathe in droplets from the cough or sneeze of an infected person.  What increases the risk?  You are more likely to develop this condition if you:   Have a long-term (chronic) disease, such as:  ? Chronic obstructive pulmonary disease (COPD).  ? Asthma.  ? Cystic fibrosis.  ? Congestive heart failure.  ? Diabetes.  ? Kidney disease.   Have HIV.   Have sickle cell disease.   Have had your spleen removed.   Do not take good care of your teeth and mouth (poor dental hygiene).   Have a medical condition that increases the risk of breathing in droplets from your own mouth and nose.   Have a weakened body defense system (immune system).   Are a smoker.   Travel to areas where the germs that cause this illness are common.   Are around certain animals or the places they live.  What are the signs or symptoms?   A dry cough.   A wet (productive) cough.   Fever.   Sweating.   Chest pain. This often happens when breathing deeply or coughing.   Fast breathing or trouble breathing.   Shortness of breath.   Shaking chills.   Feeling tired (fatigue).   Muscle aches.  How is this treated?  Treatment for this condition depends on many things. Most adults can be treated at home. In some cases, treatment must happen in a hospital. Treatment may include:   Medicines given by mouth or through an IV tube.   Being given extra oxygen.   Respiratory therapy.  In rare cases, treatment for very bad pneumonia  may include:   Using a machine to help you breathe.   Having a procedure to remove fluid from around your lungs.  Follow these instructions at home:  Medicines   Take over-the-counter and prescription medicines only as told by your doctor.  ? Only take cough medicine if you are losing sleep.   If you were prescribed an antibiotic medicine, take it as told by your doctor. Do not stop taking the antibiotic even if you start to feel better.  General instructions     Sleep with your head and neck raised (elevated). You can do this by sleeping in a recliner or by putting a few pillows under your head.   Rest as needed. Get at least 8 hours of sleep each night.   Drink enough water to keep your pee (urine) pale yellow.   Eat a healthy diet that includes plenty of vegetables, fruits, whole grains, low-fat dairy products, and lean protein.   Do not use any products that contain nicotine or tobacco. These include cigarettes, e-cigarettes, and chewing tobacco. If you need help quitting, ask your doctor.   Keep all follow-up visits as told by your doctor. This is important.  How is this prevented?  A shot (vaccine) can help prevent pneumonia. Shots are often suggested for:   People   older than 73 years of age.   People older than 73 years of age who:  ? Are having cancer treatment.  ? Have long-term (chronic) lung disease.  ? Have problems with their body's defense system.  You may also prevent pneumonia if you take these actions:   Get the flu (influenza) shot every year.   Go to the dentist as often as told.   Wash your hands often. If you cannot use soap and water, use hand sanitizer.  Contact a doctor if:   You have a fever.   You lose sleep because your cough medicine does not help.  Get help right away if:   You are short of breath and it gets worse.   You have more chest pain.   Your sickness gets worse. This is very serious if:  ? You are an older adult.  ? Your body's defense system is weak.   You  cough up blood.  Summary   Pneumonia is an infection of the lungs.   Most adults can be treated at home. Some will need treatment in a hospital.   Drink enough water to keep your pee pale yellow.   Get at least 8 hours of sleep each night.  This information is not intended to replace advice given to you by your health care provider. Make sure you discuss any questions you have with your health care provider.  Document Released: 09/20/2007 Document Revised: 11/29/2017 Document Reviewed: 11/29/2017  Elsevier Interactive Patient Education  2019 [employer]
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
## 2018-06-03 NOTE — Progress Notes (Signed)
Pt discharged to home with son via w/c with belongings. Silvadene and dressing changing supplies given. HH RN for dressing change to bilateral feet. rec'd meds from pharmacy given to son. No complaints at this time
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
## 2018-06-03 NOTE — Plan of Care (Signed)
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
## 2018-06-04 LAB — PATHOLOGIST SMEAR REVIEW

## 2019-08-27 IMAGING — DX DG CHEST 1V PORT
1 series · 1 of 1 positions shown · non-contrast
Comparison: 05/07/2018 chest radiograph.

CLINICAL DATA: 72 y/o  M; weakness and fever.

EXAM:
PORTABLE CHEST 1 VIEW

[chest]
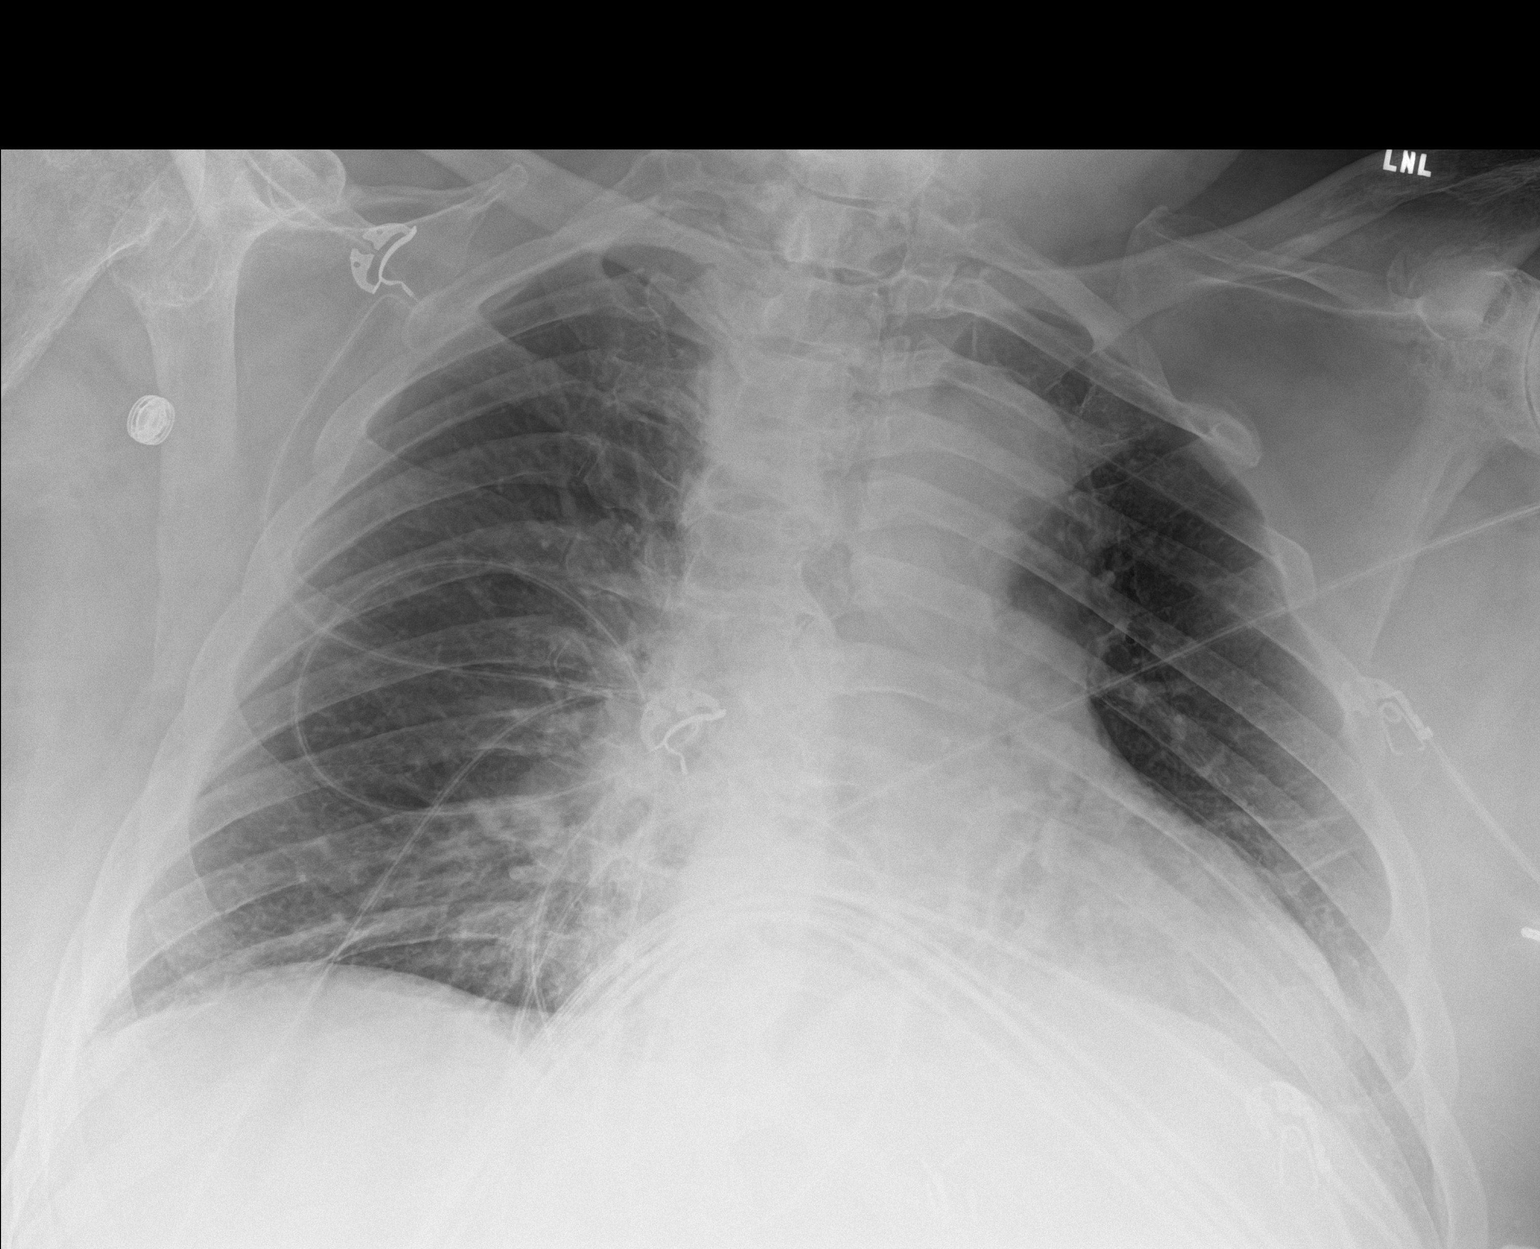

[1 of 1 positions shown; findings below may reference images not displayed]

FINDINGS: Stable normal cardiac silhouette given projection and technique.
Ill-defined opacity at the left lung base. No pleural effusion or
pneumothorax. Degenerative changes of the thoracic spine. No acute
osseous abnormality identified.
IMPRESSION: Ill-defined opacity at the left lung base may represent atelectasis
or pneumonia.

## 2020-10-14 DIAGNOSIS — J449 Chronic obstructive pulmonary disease, unspecified: Secondary | ICD-10-CM | POA: Diagnosis present

## 2020-10-14 DIAGNOSIS — R0902 Hypoxemia: Secondary | ICD-10-CM | POA: Diagnosis present

## 2021-10-18 DIAGNOSIS — I350 Nonrheumatic aortic (valve) stenosis: Secondary | ICD-10-CM

## 2021-10-20 DIAGNOSIS — I4891 Unspecified atrial fibrillation: Secondary | ICD-10-CM

## 2022-02-04 ENCOUNTER — Encounter (HOSPITAL_COMMUNITY): Payer: Self-pay | Admitting: Emergency Medicine

## 2022-02-04 ENCOUNTER — Emergency Department (HOSPITAL_COMMUNITY): Payer: Medicare Other

## 2022-02-04 ENCOUNTER — Inpatient Hospital Stay (HOSPITAL_COMMUNITY)
Admission: EM | Admit: 2022-02-04 | Discharge: 2022-02-10 | DRG: 205 | Disposition: A | Discharge status: Home Health | Payer: Medicare Other | Attending: Internal Medicine | Admitting: Internal Medicine

## 2022-02-04 ENCOUNTER — Other Ambulatory Visit: Payer: Self-pay

## 2022-02-04 DIAGNOSIS — J189 Pneumonia, unspecified organism: Secondary | ICD-10-CM | POA: Diagnosis present

## 2022-02-04 DIAGNOSIS — L03032 Cellulitis of left toe: Secondary | ICD-10-CM | POA: Diagnosis present

## 2022-02-04 DIAGNOSIS — J440 Chronic obstructive pulmonary disease with acute lower respiratory infection: Secondary | ICD-10-CM | POA: Diagnosis present

## 2022-02-04 DIAGNOSIS — L8990 Pressure ulcer of unspecified site, unspecified stage: Secondary | ICD-10-CM | POA: Insufficient documentation

## 2022-02-04 DIAGNOSIS — L89312 Pressure ulcer of right buttock, stage 2: Secondary | ICD-10-CM | POA: Diagnosis present

## 2022-02-04 DIAGNOSIS — Z1152 Encounter for screening for COVID-19: Secondary | ICD-10-CM | POA: Diagnosis not present

## 2022-02-04 DIAGNOSIS — T17890A Other foreign object in other parts of respiratory tract causing asphyxiation, initial encounter: Secondary | ICD-10-CM | POA: Diagnosis present

## 2022-02-04 DIAGNOSIS — Z6841 Body Mass Index (BMI) 40.0 and over, adult: Secondary | ICD-10-CM | POA: Diagnosis not present

## 2022-02-04 DIAGNOSIS — E1165 Type 2 diabetes mellitus with hyperglycemia: Secondary | ICD-10-CM | POA: Diagnosis not present

## 2022-02-04 DIAGNOSIS — Z7982 Long term (current) use of aspirin: Secondary | ICD-10-CM

## 2022-02-04 DIAGNOSIS — Z9081 Acquired absence of spleen: Secondary | ICD-10-CM

## 2022-02-04 DIAGNOSIS — Z794 Long term (current) use of insulin: Secondary | ICD-10-CM | POA: Diagnosis not present

## 2022-02-04 DIAGNOSIS — I480 Paroxysmal atrial fibrillation: Secondary | ICD-10-CM | POA: Diagnosis present

## 2022-02-04 DIAGNOSIS — J9809 Other diseases of bronchus, not elsewhere classified: Secondary | ICD-10-CM | POA: Diagnosis present

## 2022-02-04 DIAGNOSIS — J9621 Acute and chronic respiratory failure with hypoxia: Secondary | ICD-10-CM | POA: Diagnosis present

## 2022-02-04 DIAGNOSIS — Z7901 Long term (current) use of anticoagulants: Secondary | ICD-10-CM

## 2022-02-04 DIAGNOSIS — J9622 Acute and chronic respiratory failure with hypercapnia: Secondary | ICD-10-CM | POA: Diagnosis not present

## 2022-02-04 DIAGNOSIS — L97529 Non-pressure chronic ulcer of other part of left foot with unspecified severity: Secondary | ICD-10-CM | POA: Diagnosis present

## 2022-02-04 DIAGNOSIS — I5032 Chronic diastolic (congestive) heart failure: Secondary | ICD-10-CM | POA: Diagnosis present

## 2022-02-04 DIAGNOSIS — R911 Solitary pulmonary nodule: Secondary | ICD-10-CM | POA: Diagnosis present

## 2022-02-04 DIAGNOSIS — I482 Chronic atrial fibrillation, unspecified: Secondary | ICD-10-CM | POA: Diagnosis present

## 2022-02-04 DIAGNOSIS — Z8616 Personal history of COVID-19: Secondary | ICD-10-CM

## 2022-02-04 DIAGNOSIS — E7849 Other hyperlipidemia: Secondary | ICD-10-CM | POA: Diagnosis present

## 2022-02-04 DIAGNOSIS — J441 Chronic obstructive pulmonary disease with (acute) exacerbation: Secondary | ICD-10-CM | POA: Diagnosis present

## 2022-02-04 DIAGNOSIS — E1122 Type 2 diabetes mellitus with diabetic chronic kidney disease: Secondary | ICD-10-CM | POA: Diagnosis present

## 2022-02-04 DIAGNOSIS — I130 Hypertensive heart and chronic kidney disease with heart failure and stage 1 through stage 4 chronic kidney disease, or unspecified chronic kidney disease: Secondary | ICD-10-CM | POA: Diagnosis present

## 2022-02-04 DIAGNOSIS — E11621 Type 2 diabetes mellitus with foot ulcer: Secondary | ICD-10-CM | POA: Diagnosis present

## 2022-02-04 DIAGNOSIS — J9811 Atelectasis: Principal | ICD-10-CM | POA: Diagnosis present

## 2022-02-04 DIAGNOSIS — E1152 Type 2 diabetes mellitus with diabetic peripheral angiopathy with gangrene: Secondary | ICD-10-CM | POA: Diagnosis present

## 2022-02-04 DIAGNOSIS — Z9981 Dependence on supplemental oxygen: Secondary | ICD-10-CM

## 2022-02-04 DIAGNOSIS — T380X5A Adverse effect of glucocorticoids and synthetic analogues, initial encounter: Secondary | ICD-10-CM | POA: Diagnosis not present

## 2022-02-04 DIAGNOSIS — I2510 Atherosclerotic heart disease of native coronary artery without angina pectoris: Secondary | ICD-10-CM | POA: Diagnosis present

## 2022-02-04 DIAGNOSIS — L89612 Pressure ulcer of right heel, stage 2: Secondary | ICD-10-CM | POA: Diagnosis present

## 2022-02-04 DIAGNOSIS — C8590 Non-Hodgkin lymphoma, unspecified, unspecified site: Secondary | ICD-10-CM | POA: Diagnosis present

## 2022-02-04 DIAGNOSIS — F17290 Nicotine dependence, other tobacco product, uncomplicated: Secondary | ICD-10-CM | POA: Diagnosis present

## 2022-02-04 DIAGNOSIS — J449 Chronic obstructive pulmonary disease, unspecified: Secondary | ICD-10-CM | POA: Diagnosis present

## 2022-02-04 DIAGNOSIS — E7800 Pure hypercholesterolemia, unspecified: Secondary | ICD-10-CM | POA: Diagnosis present

## 2022-02-04 DIAGNOSIS — R0902 Hypoxemia: Secondary | ICD-10-CM | POA: Diagnosis present

## 2022-02-04 DIAGNOSIS — E1169 Type 2 diabetes mellitus with other specified complication: Secondary | ICD-10-CM | POA: Diagnosis present

## 2022-02-04 DIAGNOSIS — E669 Obesity, unspecified: Secondary | ICD-10-CM | POA: Diagnosis present

## 2022-02-04 DIAGNOSIS — N182 Chronic kidney disease, stage 2 (mild): Secondary | ICD-10-CM | POA: Diagnosis present

## 2022-02-04 DIAGNOSIS — R0602 Shortness of breath: Principal | ICD-10-CM

## 2022-02-04 DIAGNOSIS — E11649 Type 2 diabetes mellitus with hypoglycemia without coma: Secondary | ICD-10-CM | POA: Diagnosis not present

## 2022-02-04 DIAGNOSIS — Z79899 Other long term (current) drug therapy: Secondary | ICD-10-CM

## 2022-02-04 DIAGNOSIS — J9819 Other pulmonary collapse: Secondary | ICD-10-CM | POA: Diagnosis present

## 2022-02-04 DIAGNOSIS — I252 Old myocardial infarction: Secondary | ICD-10-CM

## 2022-02-04 DIAGNOSIS — Z7984 Long term (current) use of oral hypoglycemic drugs: Secondary | ICD-10-CM

## 2022-02-04 HISTORY — DX: Heart failure, unspecified: I50.9

## 2022-02-04 HISTORY — DX: Chronic obstructive pulmonary disease, unspecified: J44.9

## 2022-02-04 LAB — I-STAT VENOUS BLOOD GAS, ED
Acid-Base Excess: 6 mmol/L — ABNORMAL HIGH (ref 0.0–2.0)
Bicarbonate: 32.7 mmol/L — ABNORMAL HIGH (ref 20.0–28.0)
Calcium, Ion: 1.1 mmol/L — ABNORMAL LOW (ref 1.15–1.40)
HCT: 45 % (ref 39.0–52.0)
Hemoglobin: 15.3 g/dL (ref 13.0–17.0)
O2 Saturation: 95 %
Potassium: 4.8 mmol/L (ref 3.5–5.1)
Sodium: 136 mmol/L (ref 135–145)
TCO2: 34 mmol/L — ABNORMAL HIGH (ref 22–32)
pCO2, Ven: 55.9 mmHg (ref 44–60)
pH, Ven: 7.375 (ref 7.25–7.43)
pO2, Ven: 81 mmHg — ABNORMAL HIGH (ref 32–45)

## 2022-02-04 LAB — CBC WITH DIFFERENTIAL/PLATELET
Abs Immature Granulocytes: 0.06 10*3/uL (ref 0.00–0.07)
Basophils Absolute: 0.1 10*3/uL (ref 0.0–0.1)
Basophils Relative: 0 %
Eosinophils Absolute: 0.3 10*3/uL (ref 0.0–0.5)
Eosinophils Relative: 2 %
HCT: 42.4 % (ref 39.0–52.0)
Hemoglobin: 13.7 g/dL (ref 13.0–17.0)
Immature Granulocytes: 0 %
Lymphocytes Relative: 9 %
Lymphs Abs: 1.2 10*3/uL (ref 0.7–4.0)
MCH: 31.9 pg (ref 26.0–34.0)
MCHC: 32.3 g/dL (ref 30.0–36.0)
MCV: 98.8 fL (ref 80.0–100.0)
Monocytes Absolute: 1.4 10*3/uL — ABNORMAL HIGH (ref 0.1–1.0)
Monocytes Relative: 10 %
Neutro Abs: 10.7 10*3/uL — ABNORMAL HIGH (ref 1.7–7.7)
Neutrophils Relative %: 79 %
Platelets: 229 10*3/uL (ref 150–400)
RBC: 4.29 MIL/uL (ref 4.22–5.81)
RDW: 14.5 % (ref 11.5–15.5)
WBC: 13.8 10*3/uL — ABNORMAL HIGH (ref 4.0–10.5)
nRBC: 0 % (ref 0.0–0.2)

## 2022-02-04 LAB — TROPONIN I (HIGH SENSITIVITY): Troponin I (High Sensitivity): 7 ng/L (ref <18)

## 2022-02-04 LAB — I-STAT CHEM 8, ED
BUN: 24 mg/dL — ABNORMAL HIGH (ref 8–23)
Calcium, Ion: 1.09 mmol/L — ABNORMAL LOW (ref 1.15–1.40)
Chloride: 95 mmol/L — ABNORMAL LOW (ref 98–111)
Creatinine, Ser: 0.8 mg/dL (ref 0.61–1.24)
Glucose, Bld: 404 mg/dL — ABNORMAL HIGH (ref 70–99)
HCT: 46 % (ref 39.0–52.0)
Hemoglobin: 15.6 g/dL (ref 13.0–17.0)
Potassium: 4.8 mmol/L (ref 3.5–5.1)
Sodium: 136 mmol/L (ref 135–145)
TCO2: 32 mmol/L (ref 22–32)

## 2022-02-04 LAB — COMPREHENSIVE METABOLIC PANEL
ALT: 17 U/L (ref 0–44)
AST: 18 U/L (ref 15–41)
Albumin: 3.4 g/dL — ABNORMAL LOW (ref 3.5–5.0)
Alkaline Phosphatase: 74 U/L (ref 38–126)
Anion gap: 13 (ref 5–15)
BUN: 21 mg/dL (ref 8–23)
CO2: 29 mmol/L (ref 22–32)
Calcium: 8.9 mg/dL (ref 8.9–10.3)
Chloride: 97 mmol/L — ABNORMAL LOW (ref 98–111)
Creatinine, Ser: 0.92 mg/dL (ref 0.61–1.24)
GFR, Estimated: >60 mL/min (ref >60)
Glucose, Bld: 390 mg/dL — ABNORMAL HIGH (ref 70–99)
Potassium: 5.1 mmol/L (ref 3.5–5.1)
Sodium: 139 mmol/L (ref 135–145)
Total Bilirubin: 0.5 mg/dL (ref 0.3–1.2)
Total Protein: 5.9 g/dL — ABNORMAL LOW (ref 6.5–8.1)

## 2022-02-04 LAB — CBG MONITORING, ED: Glucose-Capillary: 352 mg/dL — ABNORMAL HIGH (ref 70–99)

## 2022-02-04 LAB — BRAIN NATRIURETIC PEPTIDE: B Natriuretic Peptide: 243.2 pg/mL — ABNORMAL HIGH (ref 0.0–100.0)

## 2022-02-04 LAB — RESP PANEL BY RT-PCR (FLU A&B, COVID) ARPGX2
Influenza A by PCR: NEGATIVE
Influenza B by PCR: NEGATIVE
SARS Coronavirus 2 by RT PCR: NEGATIVE

## 2022-02-04 MED ORDER — FUROSEMIDE 10 MG/ML IJ SOLN
60.0000 mg | Freq: Once | INTRAMUSCULAR | Status: AC
  Administered 2022-02-04: 60 mg via INTRAVENOUS
  Filled 2022-02-04: qty 6

## 2022-02-04 NOTE — H&P (Signed)
History and Physical    Patient: Jordan Wells UXL:[id number] DOB: 09-May-1945 DOA: 02/04/2022 DOS: the patient was seen and examined on 02/04/2022 PCP: Patient, No Pcp Per  Patient coming from: Home  Chief Complaint:  Chief Complaint  Patient presents with   Respiratory Distress   HPI: Jordan Wells is a 76 y.o. male with medical history significant of diabetes, hypertension, hyperlipidemia, who was brought in by EMS from home secondary to acute respiratory distress.  Patient has chronic respiratory failure on 4 L of oxygen.  He has been having cough apparently with worsening shortness of breath for 3 to 4 days.  With the shortness of breath his oxygen sats was 89% on his regular regimen.  He bumped oxygen up to 6 L.  Brought into the ER where his sats was found to be 99% on 6 L.  Patient has known history of diastolic CHF and COPD which is why he is on chronic oxygen.  He also has A-fib.  When examined patient seem to have diminished breath sounds bilaterally more on the left side.  Chest x-ray showed complete opacification of the left side.  Appears to be due to significant mucous plugging.  Pulmonary critical care consulted but patient is at his baseline home O2.  They recommended no bronchoscopy.  Patient to be admitted to the stepdown unit.  Chest physiotherapy and treatment to dislodge the mucous plugging.  His initial viral screening is negative.  Patient will be admitted for further evaluation and treatment.  Review of Systems: As mentioned in the history of present illness. All other systems reviewed and are negative. Past Medical History:  Diagnosis Date   Diabetes mellitus without complication (HCC)    High cholesterol    Hypertension    Past Surgical History:  Procedure Laterality Date   SPLENECTOMY     Social History:  reports that he has never smoked. He has never used smokeless tobacco. He reports that he does not drink alcohol and does not use drugs.  No Known  Allergies  Family History  Problem Relation Age of Onset   Diabetes Mellitus II Neg Hx     Prior to Admission medications   Medication Sig Start Date End Date Taking? Authorizing Provider  acetaminophen (TYLENOL) 325 MG tablet Take 2 tablets (650 mg total) by mouth every 6 (six) hours as needed for mild pain, fever or headache. 06/03/18   Cherene Altes, MD  aspirin 325 MG tablet Take 325 mg by mouth daily.    [provider]  atorvastatin (LIPITOR) 20 MG tablet Take 0.5 tablets by mouth 2 (two) times daily. 02/12/10   [provider]  azithromycin (ZITHROMAX) 250 MG tablet Take 1 tablet (250 mg total) by mouth daily. 06/04/18   Cherene Altes, MD  insulin glargine (LANTUS) 100 UNIT/ML injection Inject 30 Units into the skin 2 (two) times daily.    [provider]  lisinopril (PRINIVIL,ZESTRIL) 20 MG tablet Take 1 tablet by mouth daily. 05/10/18   [provider]  metFORMIN (GLUCOPHAGE) 500 MG tablet Take 1 tablet by mouth 2 (two) times daily. 02/12/10   [provider]  silver sulfADIAZINE (SILVADENE) 1 % cream Apply topically 2 (two) times daily. 06/03/18   Cherene Altes, MD    Physical Exam: Vitals:   02/04/22 2245 02/04/22 2300 02/04/22 2315 02/04/22 2330  BP: (!) 124/92 124/85 105/68 (!) 111/92  Pulse: 95 100 92 93  Resp: '13 20 18 17  '$ Temp:  TempSrc:      SpO2: 97% 98% 97% 97%   Constitutional: Morbidly obese, acutely ill looking, in mild distress due to respiratory symptoms, calm, comfortable Eyes: PERRL, lids and conjunctivae normal ENMT: Mucous membranes are moist. Posterior pharynx clear of any exudate or lesions.Normal dentition.  Neck: normal, supple, no masses, no thyromegaly Respiratory: Absent breath sounds on the left side, decreased on the right side, some basal crackles.  Cardiovascular: Regular rate and rhythm, no murmurs / rubs / gallops. No extremity edema. 2+ pedal pulses. No carotid bruits.  Abdomen: no  tenderness, no masses palpated. No hepatosplenomegaly. Bowel sounds positive.  Musculoskeletal: Good range of motion, no joint swelling or tenderness, Skin: no rashes, lesions, ulcers. No induration Neurologic: CN 2-12 grossly intact. Sensation intact, DTR normal. Strength 5/5 in all 4.  Psychiatric: Normal judgment and insight. Alert and oriented x 3. Normal mood  Data Reviewed:  Chemistry showed chloride 95 glucose 404 BUN 24 creatinine one-point.  Hemoglobin is 15.6.  Acute viral screen negative for influenza and COVID.  Portable chest x-ray showed consolidation of the majority of the left lung airspace interstitial process of the right lung most probably in the lung bases.  Also likely bilateral pleural effusions.  Repeat PA and lateral x-rays shows complete opacity of the left hemothorax perihilar infiltrate on the right progressive since previous study.  Assessment and Plan:   #1 acute on chronic respiratory failure with hypoxemia: Secondary to mucous plugging and possible pneumonia leading to lung collapse.  Appreciate pulmonary help.  We will admit the patient.  Chest physiotherapy.  Increase oxygenation.  Consider IV antibiotics for possible pneumonia.  Steroids if needed.  #2 left lung opacification with mucous plugging: Continue chest physiotherapy.  Pulmonary to make necessary recommendations.  #3 diabetes: Initiate sliding scale insulin with long-acting insulin.  #4 essential hypertension: Continue blood pressure medications.  #5 COPD: Suspected acute exacerbation.  Continue antibiotics with breathing treatments.  Consider steroids.  #6 morbid obesity: Dietary counseling     Advance Care Planning:   Code Status: Prior full code  Consults: Pulmonary critical care  Family Communication: No family at bedside  Severity of Illness: The appropriate patient status for this patient is INPATIENT. Inpatient status is judged to be reasonable and necessary in order to provide the  required intensity of service to ensure the patient's safety. The patient's presenting symptoms, physical exam findings, and initial radiographic and laboratory data in the context of their chronic comorbidities is felt to place them at high risk for further clinical deterioration. Furthermore, it is not anticipated that the patient will be medically stable for discharge from the hospital within 2 midnights of admission.   * I certify that at the point of admission it is my clinical judgment that the patient will require inpatient hospital care spanning beyond 2 midnights from the point of admission due to high intensity of service, high risk for further deterioration and high frequency of surveillance required.*  AuthorBarbette Merino, MD 02/04/2022 11:58 PM  For on call review [email or web address].
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
## 2022-02-04 NOTE — ED Triage Notes (Signed)
Patient with increased respiratory distress for the last three days, was tripodding when EMS arrived.  Patient wears 4l oxygen chronically.  He bumped it up to 6L and was found at 89%.  Patient is now at 99% with 6L en route to ED.  Patient does have history of CHF and COPD.  Patient is now on 4L now on arrival.  Patient in afib, controlled.  Patient's breath sounds absent in the bases, diminished and rales in uppers.

## 2022-02-05 ENCOUNTER — Inpatient Hospital Stay (HOSPITAL_COMMUNITY): Payer: Medicare Other

## 2022-02-05 ENCOUNTER — Encounter (HOSPITAL_COMMUNITY): Payer: Self-pay | Admitting: Internal Medicine

## 2022-02-05 DIAGNOSIS — J9621 Acute and chronic respiratory failure with hypoxia: Secondary | ICD-10-CM

## 2022-02-05 DIAGNOSIS — I509 Heart failure, unspecified: Secondary | ICD-10-CM

## 2022-02-05 DIAGNOSIS — J9811 Atelectasis: Secondary | ICD-10-CM

## 2022-02-05 LAB — CREATININE, SERUM
Creatinine, Ser: 0.88 mg/dL (ref 0.61–1.24)
GFR, Estimated: >60 mL/min (ref >60)

## 2022-02-05 LAB — CBC
HCT: 44.7 % (ref 39.0–52.0)
Hemoglobin: 13.6 g/dL (ref 13.0–17.0)
MCH: 31.1 pg (ref 26.0–34.0)
MCHC: 30.4 g/dL (ref 30.0–36.0)
MCV: 102.1 fL — ABNORMAL HIGH (ref 80.0–100.0)
Platelets: 227 10*3/uL (ref 150–400)
RBC: 4.38 MIL/uL (ref 4.22–5.81)
RDW: 14.6 % (ref 11.5–15.5)
WBC: 13.2 10*3/uL — ABNORMAL HIGH (ref 4.0–10.5)
nRBC: 0 % (ref 0.0–0.2)

## 2022-02-05 LAB — URINALYSIS, ROUTINE W REFLEX MICROSCOPIC
Bacteria, UA: NONE SEEN
Bilirubin Urine: NEGATIVE
Glucose, UA: >=500 mg/dL — AB
Hgb urine dipstick: NEGATIVE
Ketones, ur: NEGATIVE mg/dL
Leukocytes,Ua: NEGATIVE
Nitrite: NEGATIVE
Protein, ur: 30 mg/dL — AB
Specific Gravity, Urine: 1.018 (ref 1.005–1.030)
pH: 5 (ref 5.0–8.0)

## 2022-02-05 LAB — CBG MONITORING, ED
Glucose-Capillary: 142 mg/dL — ABNORMAL HIGH (ref 70–99)
Glucose-Capillary: 220 mg/dL — ABNORMAL HIGH (ref 70–99)
Glucose-Capillary: 220 mg/dL — ABNORMAL HIGH (ref 70–99)
Glucose-Capillary: 291 mg/dL — ABNORMAL HIGH (ref 70–99)
Glucose-Capillary: 305 mg/dL — ABNORMAL HIGH (ref 70–99)

## 2022-02-05 LAB — COMPREHENSIVE METABOLIC PANEL
ALT: 15 U/L (ref 0–44)
AST: 14 U/L — ABNORMAL LOW (ref 15–41)
Albumin: 3.5 g/dL (ref 3.5–5.0)
Alkaline Phosphatase: 67 U/L (ref 38–126)
Anion gap: 11 (ref 5–15)
BUN: 19 mg/dL (ref 8–23)
CO2: 30 mmol/L (ref 22–32)
Calcium: 8.9 mg/dL (ref 8.9–10.3)
Chloride: 97 mmol/L — ABNORMAL LOW (ref 98–111)
Creatinine, Ser: 0.85 mg/dL (ref 0.61–1.24)
GFR, Estimated: >60 mL/min (ref >60)
Glucose, Bld: 289 mg/dL — ABNORMAL HIGH (ref 70–99)
Potassium: 4.6 mmol/L (ref 3.5–5.1)
Sodium: 138 mmol/L (ref 135–145)
Total Bilirubin: 0.7 mg/dL (ref 0.3–1.2)
Total Protein: 5.8 g/dL — ABNORMAL LOW (ref 6.5–8.1)

## 2022-02-05 LAB — BETA-HYDROXYBUTYRIC ACID: Beta-Hydroxybutyric Acid: 0.12 mmol/L (ref 0.05–0.27)

## 2022-02-05 LAB — ECHOCARDIOGRAM COMPLETE
AR max vel: 1.55 cm2
AV Area VTI: 1.55 cm2
AV Area mean vel: 1.46 cm2
AV Mean grad: 9.5 mmHg
AV Peak grad: 17.1 mmHg
Ao pk vel: 2.07 m/s
Height: 70 in
MV VTI: 1.72 cm2
S' Lateral: 3.5 cm
Weight: 4232.83 oz

## 2022-02-05 LAB — HEMOGLOBIN A1C
Hgb A1c MFr Bld: 11.5 % — ABNORMAL HIGH (ref 4.8–5.6)
Mean Plasma Glucose: 283.35 mg/dL

## 2022-02-05 LAB — GLUCOSE, CAPILLARY: Glucose-Capillary: 183 mg/dL — ABNORMAL HIGH (ref 70–99)

## 2022-02-05 LAB — TROPONIN I (HIGH SENSITIVITY): Troponin I (High Sensitivity): 7 ng/L (ref <18)

## 2022-02-05 LAB — PROCALCITONIN: Procalcitonin: <0.1 ng/mL

## 2022-02-05 LAB — OSMOLALITY: Osmolality: 310 mOsm/kg — ABNORMAL HIGH (ref 275–295)

## 2022-02-05 MED ORDER — ONDANSETRON HCL 4 MG PO TABS: 4.0000 mg | ORAL_TABLET | Freq: Four times a day (QID) | ORAL | Status: DC | PRN

## 2022-02-05 MED ORDER — FUROSEMIDE 40 MG PO TABS
40.0000 mg | ORAL_TABLET | Freq: Every day | ORAL | Status: DC
  Administered 2022-02-05: 40 mg via ORAL
  Filled 2022-02-05: qty 2

## 2022-02-05 MED ORDER — METHYLPREDNISOLONE SODIUM SUCC 40 MG IJ SOLR
40.0000 mg | Freq: Every day | INTRAMUSCULAR | Status: DC
  Administered 2022-02-06 – 2022-02-07 (×2): 40 mg via INTRAVENOUS
  Filled 2022-02-05 (×2): qty 1

## 2022-02-05 MED ORDER — ALBUTEROL SULFATE (2.5 MG/3ML) 0.083% IN NEBU
2.5000 mg | INHALATION_SOLUTION | RESPIRATORY_TRACT | Status: DC | PRN
  Filled 2022-02-05: qty 3

## 2022-02-05 MED ORDER — SODIUM CHLORIDE 3 % IN NEBU
4.0000 mL | INHALATION_SOLUTION | Freq: Two times a day (BID) | RESPIRATORY_TRACT | Status: AC
  Administered 2022-02-05 – 2022-02-07 (×5): 4 mL via RESPIRATORY_TRACT
  Filled 2022-02-05 (×9): qty 4

## 2022-02-05 MED ORDER — SODIUM CHLORIDE 0.9 % IV SOLN
2.0000 g | Freq: Once | INTRAVENOUS | Status: AC
  Administered 2022-02-05: 2 g via INTRAVENOUS
  Filled 2022-02-05: qty 12.5

## 2022-02-05 MED ORDER — BUDESONIDE 0.5 MG/2ML IN SUSP
0.5000 mg | Freq: Two times a day (BID) | RESPIRATORY_TRACT | Status: DC
  Administered 2022-02-05 – 2022-02-10 (×11): 0.5 mg via RESPIRATORY_TRACT
  Filled 2022-02-05 (×13): qty 2

## 2022-02-05 MED ORDER — INSULIN ASPART 100 UNIT/ML IJ SOLN
4.0000 [IU] | Freq: Three times a day (TID) | INTRAMUSCULAR | Status: DC
  Administered 2022-02-05 (×2): 4 [IU] via SUBCUTANEOUS

## 2022-02-05 MED ORDER — MORPHINE SULFATE (PF) 2 MG/ML IV SOLN
2.0000 mg | INTRAVENOUS | Status: DC | PRN
  Administered 2022-02-09: 2 mg via INTRAVENOUS
  Filled 2022-02-05: qty 1

## 2022-02-05 MED ORDER — GUAIFENESIN ER 600 MG PO TB12
600.0000 mg | ORAL_TABLET | Freq: Two times a day (BID) | ORAL | Status: DC
  Administered 2022-02-05 – 2022-02-10 (×12): 600 mg via ORAL
  Filled 2022-02-05 (×14): qty 1

## 2022-02-05 MED ORDER — APIXABAN 5 MG PO TABS
5.0000 mg | ORAL_TABLET | Freq: Two times a day (BID) | ORAL | Status: DC
  Administered 2022-02-05 (×2): 5 mg via ORAL
  Filled 2022-02-05 (×2): qty 1

## 2022-02-05 MED ORDER — LACTATED RINGERS IV SOLN: INTRAVENOUS | Status: DC

## 2022-02-05 MED ORDER — ACETAMINOPHEN 325 MG PO TABS: 650.0000 mg | ORAL_TABLET | Freq: Four times a day (QID) | ORAL | Status: DC | PRN

## 2022-02-05 MED ORDER — SODIUM CHLORIDE 0.9 % IV SOLN
2.0000 g | INTRAVENOUS | Status: DC
  Administered 2022-02-05 – 2022-02-07 (×3): 2 g via INTRAVENOUS
  Filled 2022-02-05 (×3): qty 20

## 2022-02-05 MED ORDER — METHYLPREDNISOLONE SODIUM SUCC 40 MG IJ SOLR
40.0000 mg | Freq: Two times a day (BID) | INTRAMUSCULAR | Status: DC
  Administered 2022-02-05: 40 mg via INTRAVENOUS
  Filled 2022-02-05: qty 1

## 2022-02-05 MED ORDER — ENOXAPARIN SODIUM 40 MG/0.4ML IJ SOSY: 40.0000 mg | PREFILLED_SYRINGE | INTRAMUSCULAR | Status: DC

## 2022-02-05 MED ORDER — ALBUTEROL SULFATE (2.5 MG/3ML) 0.083% IN NEBU
2.5000 mg | INHALATION_SOLUTION | Freq: Two times a day (BID) | RESPIRATORY_TRACT | Status: DC
  Administered 2022-02-05 – 2022-02-10 (×11): 2.5 mg via RESPIRATORY_TRACT
  Filled 2022-02-05 (×11): qty 3

## 2022-02-05 MED ORDER — ARFORMOTEROL TARTRATE 15 MCG/2ML IN NEBU
15.0000 ug | INHALATION_SOLUTION | Freq: Two times a day (BID) | RESPIRATORY_TRACT | Status: DC
  Administered 2022-02-05 – 2022-02-10 (×11): 15 ug via RESPIRATORY_TRACT
  Filled 2022-02-05 (×15): qty 2

## 2022-02-05 MED ORDER — PERFLUTREN LIPID MICROSPHERE
1.0000 mL | INTRAVENOUS | Status: AC | PRN
  Administered 2022-02-05: 4 mL via INTRAVENOUS

## 2022-02-05 MED ORDER — INSULIN ASPART 100 UNIT/ML IJ SOLN
0.0000 [IU] | Freq: Three times a day (TID) | INTRAMUSCULAR | Status: DC
  Administered 2022-02-05: 7 [IU] via SUBCUTANEOUS
  Administered 2022-02-05: 11 [IU] via SUBCUTANEOUS
  Administered 2022-02-05: 3 [IU] via SUBCUTANEOUS

## 2022-02-05 MED ORDER — SODIUM CHLORIDE 0.9 % IV SOLN
500.0000 mg | Freq: Every day | INTRAVENOUS | Status: DC
  Administered 2022-02-05 – 2022-02-07 (×3): 500 mg via INTRAVENOUS
  Filled 2022-02-05 (×4): qty 5

## 2022-02-05 MED ORDER — ONDANSETRON HCL 4 MG/2ML IJ SOLN
4.0000 mg | Freq: Four times a day (QID) | INTRAMUSCULAR | Status: DC | PRN
  Administered 2022-02-07 – 2022-02-08 (×2): 4 mg via INTRAVENOUS
  Filled 2022-02-05 (×2): qty 2

## 2022-02-05 MED ORDER — INSULIN ASPART 100 UNIT/ML IJ SOLN
0.0000 [IU] | Freq: Every day | INTRAMUSCULAR | Status: DC
  Administered 2022-02-05: 4 [IU] via SUBCUTANEOUS

## 2022-02-05 MED ORDER — SODIUM CHLORIDE 0.9 % IV SOLN: 2.0000 g | Freq: Three times a day (TID) | INTRAVENOUS | Status: DC

## 2022-02-05 MED ORDER — INSULIN GLARGINE-YFGN 100 UNIT/ML ~~LOC~~ SOLN
30.0000 [IU] | Freq: Every day | SUBCUTANEOUS | Status: DC
  Administered 2022-02-05 (×2): 30 [IU] via SUBCUTANEOUS
  Filled 2022-02-05 (×3): qty 0.3

## 2022-02-05 NOTE — ED Notes (Addendum)
Pt's cardiac rhythm - afib w/ alternating between ventricular trigeminy and bigeminy. Pt has unifocal PVC's, occasional couplet PVCs, and occasional triplett PVCs. New EKG obtained

## 2022-02-05 NOTE — ED Notes (Signed)
Pt moved onto hospital bed

## 2022-02-05 NOTE — Consult Note (Addendum)
NAME:  Jordan Wells, MRN:  [id number], DOB:  1946/03/26, LOS: 1 ADMISSION DATE:  02/04/2022, CONSULTATION DATE:  10/22 REFERRING MD:  Dr. Algis Liming, CHIEF COMPLAINT:  SOB   History of Present Illness:  76 y/o M who presented to [facility] ER on 10/21 with reports of SOB.  The patient reported 3 days of SOB.  He was in tripod position on EMS arrival.  At baseline he is 4L O2 dependent and required an increase to 6L to maintain saturations.  He was in atrial fibrillation on arrival, rate controlled.  Initial evaluation notable for decreased breath sounds on left.  Initial CXR showed opacification on the left side - layering effusion vs mucus plugging.  COVID/influenza screening negative.  Initial labs- Na 138, K 4.6, Cl 97, CO2 30, glucose 289, BUN 19, cr 0.85, WBC 13.2, Hgb 13.6, platelets 227.  The patient was admitted for further care.   PCCM consulted for pulmonary evaluation.   Pt reports he has difficulty lying flat due to SOB.  He uses 4L O2 at baseline. Has felt more SOB for the last 4 days. He is unaware of why he had a pleural effusion with interventions in the past.  States he has a hx of an irregular heart rhythm / murmur and is on anticoagulation.   Pertinent  Medical History  CAD s/p MI HTN HLD  DM  Chronic Diastolic CHF  4L O2 Dependent COPD Cigar Smoker Pleural Effusion - 02/2021, s/p bronch, thoracoscopy, pleural biopsy, pleurX cath.  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 Pleural fluid negative for malignant cells.  COVID -19 - 09/2020  Fournier Gangrene - 2020 Cellulitis of LE's Non-Hodgkin's Lymphoma   Significant Hospital Events: Including procedures, antibiotic start and stop dates in addition to other pertinent events   10/21 Admit with SOB, LLL atelectasis/PAN  Interim History / Subjective:  As above  Afebrile  Sitting up in chair   Objective   Blood pressure 106/67, pulse 98, temperature 98.1 F (36.7 C), temperature source Oral,  resp. rate 18, height '5\' 10"'$  (1.778 m), weight 120 kg, SpO2 92 %.        Intake/Output Summary (Last 24 hours) at 02/05/2022 7902 Last data filed at 02/05/2022 0316 Gross per 24 hour  Intake 100 ml  Output 750 ml  Net -650 ml   Filed Weights   02/05/22 0305  Weight: 120 kg    Examination: General: elderly adult male sitting up in bed in NAD HENT: MM pink/moist, [place] O2 4L (baseline) Lungs: non-labored at rest, clear on right with good air entry, diminished on left / little to no air movement  Cardiovascular: s1s2 irr irr, AF on monitor (pt reports is baseline, on anticoagulation  Abdomen: soft, obese, bsx4 active  Extremities: thin/fragile dry, flaky skin, multiple small abrasions on LE's, 2+ BLE pitting edema  Neuro: AAOx4, speech clear, MAE  Resolved Hospital Problem list     Assessment & Plan:   Recurrent Left Pleural Effusion  LLL Atelectasis vs CAP,  Patient with hx of the same, s/p placement of pleurx catheter in 2022.  Catheter fell out.  Pleural fluid cytology was negative. Pleural biopsy negative as well. Pleural fluid culture was negative at that time. It was thought he had entrapped lung. Assessed with [place] at bedside, appears like consolidated lung, no obvious pocket of pleural fluid.   -appreciate [facility] excellent care of patient  -add hypertonic saline nebs with albuterol BID  -empiric CAP coverage  -reduce  steroids to 40 mg QD, no wheezing on exam > can likely wean quickly or discontinue -assess CT chest w/o contrast to help differentiate effusion vs consolidation  -follow intermittent CXR  -assess PCT, if negative discontinue abx  Chronic Oxygen Dependent COPD, not in acute exacerbation  Baseline 4L dependent. No pulmonary meds listed in home med list.  No PFT's available.  -continue O2, wean for sats 88-94% -BID albuterol  -pulmicort + albuterol   AF Rate controlled  -per [facility]   DM II -per [facility]   Best Practice (right click and "Reselect all SmartList  Selections" daily)  Per [facility]   Labs   CBC: Recent Labs  Lab 02/04/22 2040 02/04/22 2050 02/05/22 0239  WBC 13.8*  --  13.2*  NEUTROABS 10.7*  --   --   HGB 13.7 15.6  15.3 13.6  HCT 42.4 46.0  45.0 44.7  MCV 98.8  --  102.1*  PLT 229  --  161    Basic Metabolic Panel: Recent Labs  Lab 02/04/22 2040 02/04/22 2050 02/04/22 2358 02/05/22 0239  NA 139 136  136  --  138  K 5.1 4.8  4.8  --  4.6  CL 97* 95*  --  97*  CO2 29  --   --  30  GLUCOSE 390* 404*  --  289*  BUN 21 24*  --  19  CREATININE 0.92 0.80 0.88 0.85  CALCIUM 8.9  --   --  8.9   GFR: Estimated Creatinine Clearance: 96 mL/min (by C-G formula based on SCr of 0.85 mg/dL). Recent Labs  Lab 02/04/22 2040 02/05/22 0239  WBC 13.8* 13.2*    Liver Function Tests: Recent Labs  Lab 02/04/22 2040 02/05/22 0239  AST 18 14*  ALT 17 15  ALKPHOS 74 67  BILITOT 0.5 0.7  PROT 5.9* 5.8*  ALBUMIN 3.4* 3.5   No results for input(s): "LIPASE", "AMYLASE" in the last 168 hours. No results for input(s): "AMMONIA" in the last 168 hours.  ABG    Component Value Date/Time   HCO3 32.7 (H) 02/04/2022 2050   TCO2 32 02/04/2022 2050   TCO2 34 (H) 02/04/2022 2050   O2SAT 95 02/04/2022 2050     Coagulation Profile: No results for input(s): "INR", "PROTIME" in the last 168 hours.  Cardiac Enzymes: No results for input(s): "CKTOTAL", "CKMB", "CKMBINDEX", "TROPONINI" in the last 168 hours.  HbA1C: Hgb A1c MFr Bld  Date/Time Value Ref Range Status  02/05/2022 01:42 AM 11.5 (H) 4.8 - 5.6 % Final    Comment:    (NOTE) Pre diabetes:          5.7%-6.4%  Diabetes:              >6.4%  Glycemic control for   <7.0% adults with diabetes     CBG: Recent Labs  Lab 02/04/22 2049 02/05/22 0045 02/05/22 0444  GLUCAP 352* 305* 220*    Review of Systems: Positives in [place]  Gen: Denies fever, chills, weight change, fatigue, night sweats HEENT: Denies blurred vision, double vision, hearing loss, tinnitus, sinus  congestion, rhinorrhea, sore throat, neck stiffness, dysphagia PULM: Denies shortness of breath, cough, sputum production, hemoptysis, wheezing CV: Denies chest pain, edema, orthopnea, paroxysmal nocturnal dyspnea, palpitations GI: Denies abdominal pain, nausea, vomiting, diarrhea, hematochezia, melena, constipation, change in bowel habits GU: Denies dysuria, hematuria, polyuria, oliguria, urethral discharge Endocrine: Denies hot or cold intolerance, polyuria, polyphagia or appetite change Derm: Denies rash, dry skin, scaling or peeling skin change  Heme: Denies easy bruising, bleeding, bleeding gums Neuro: Denies headache, numbness, weakness, slurred speech, loss of memory or consciousness  Past Medical History:  He,  has a past medical history of Diabetes mellitus without complication ([place]), High cholesterol, and Hypertension.   Surgical History:   Past Surgical History:  Procedure Laterality Date   SPLENECTOMY       Social History:   reports that he has never smoked. He has never used smokeless tobacco. He reports that he does not drink alcohol and does not use drugs.   Family History:  His family history is negative for Diabetes Mellitus II.   Allergies No Known Allergies   Home Medications  Prior to Admission medications   Medication Sig Start Date End Date Taking? Authorizing Provider  acetaminophen (TYLENOL) 325 MG tablet Take 2 tablets (650 mg total) by mouth every 6 (six) hours as needed for mild pain, fever or headache. 06/03/18   Cherene Altes, MD  aspirin 325 MG tablet Take 325 mg by mouth daily.    [provider]  atorvastatin (LIPITOR) 20 MG tablet Take 0.5 tablets by mouth 2 (two) times daily. 02/12/10   [provider]  azithromycin (ZITHROMAX) 250 MG tablet Take 1 tablet (250 mg total) by mouth daily. 06/04/18   Cherene Altes, MD  insulin glargine (LANTUS) 100 UNIT/ML injection Inject 30 Units into the skin 2 (two) times daily.     [provider]  lisinopril (PRINIVIL,ZESTRIL) 20 MG tablet Take 1 tablet by mouth daily. 05/10/18   [provider]  metFORMIN (GLUCOPHAGE) 500 MG tablet Take 1 tablet by mouth 2 (two) times daily. 02/12/10   [provider]  silver sulfADIAZINE (SILVADENE) 1 % cream Apply topically 2 (two) times daily. 06/03/18   Cherene Altes, MD     Critical care time: n/a    Noe Gens, MSN, APRN, NP-C, AGACNP-BC [facility] 02/05/2022, 7:18 AM   Please see [email or web address] for pager details.   From 7A-7P if no response, please call [phone] After hours, please call ELink [phone]

## 2022-02-05 NOTE — Progress Notes (Signed)
PROGRESS NOTE   Praneeth Bussey  UXL:[id number]    DOB: 18-Jul-1945    DOA: 02/04/2022  PCP: Delorise Shiner, MD   I have briefly reviewed patients previous medical records in [facility].  Chief Complaint  Patient presents with   Respiratory Distress    Brief Narrative:  76 year old male, 76 year old male, lives with his son, ambulates with the help of walker or cane, PMH of chronic respiratory failure with hypoxia on home oxygen 4 L/min continuously, CAD s/p MI, HTN, HLD, DM, chronic diastolic CHF, COPD, tobacco use/cigars, recurrent left pleural effusion 02/2021, s/p bronchoscopy, thoracoscopy, pleural biopsy, Pleurx catheter placement at [facility], pleural fluid negative for malignant cells per PCCM, NHL s/p splenectomy and no chemotherapy or radiation, prolonged COVID-19 infection, presented to the ED on 02/04/2022 with complaints of 3 to 4 days history of progressive dyspnea, nonproductive cough, chronic orthopnea, worsening bilateral lower extremity swelling.  With his usual home oxygen, oxygen saturations were 89% and he increased home oxygen to 6 L/min.  On EMS arrival, in tripod position, noted to be in A-fib-rate controlled.  Admitted for acute on chronic respiratory failure with hypoxia, multifactorial due to whiteout of left lung on chest x-ray, left lower lobe atelectasis versus CAP versus recurrent left pleural effusion.  PCCM consulted.  Assessment & Plan:  Principal Problem:   Acute on chronic respiratory failure with hypoxemia (HCC) Active Problems:   Diabetes mellitus type 2 in obese (HCC)   CAD (coronary artery disease)   Chronic atrial fibrillation (HCC)   Morbid obesity (HCC)   Hypoxia   Other hyperlipidemia   Acute on chronic respiratory failure with hypoxia: On home oxygen 4 L/min.  This had increased to a peak of 6 L/min.  Etiology has noted below.  Has been weaned down to his prior home oxygen.  Incentive spirometry.  Flutter valve.  Left lower lobe  atelectasis versus CAP (less likely): Whiteout of left lung fields on chest x-ray.  PCCM input appreciated, getting CT chest to further evaluate.  Bedside ultrasound without significant fluid to tap but noted consolidation.  Continue IV ceftriaxone, azithromycin, hypertonic saline nebs.  Procalcitonin negative, consider stopping antibiotics after CT chest reviewed.  Follow intermittent chest x-ray.  Incentive spirometry.  Addendum: CT chest 10/22: No significant pleural effusion.  Complete combined collapse and consolidation of the left lung with an atelectatic and opacified left mainstem bronchus.  Less pronounced dependent right lower lobe consolidation versus atelectasis.  Recurrent left pleural effusion: As per PCCM, history of same in 02/2021, had Pleurx catheter placed, catheter fell out, pleural fluid cytology/biopsy and culture were negative.  It was felt that he had entrapped lung.  Bedside ultrasound without fluid to tap.  Getting CT chest to further evaluate.  COPD: No clinical bronchospasm.  Continue Pulmicort, albuterol.  Aim for O2 saturation of 88-94%.  IV Solu-Medrol reduced to 40 mg daily, could consider stopping in a.m.  Paroxysmal A-fib: Rate controlled.  Continue apixaban 5 Mg twice daily.  Essential hypertension: Controlled.  Does not appear to be on med.  Awaiting pharmacy to do home med rec review.  Type II DM: A1c of 11.5 suggests poor outpatient control.  Steroids likely to make it worse.  Awaiting pharmacy to do home med rec review.  Currently on Lantus 30 units at bedtime and NovoLog SSI.  Add mealtime NovoLog 4 units.  DM coordinator consultation.  Hyperlipidemia: Resume statins after home med rec completed.  Anasarca/bilateral lower extremity edema/chronic diastolic CHF: Did not find a  2D echo in [place] or in [place].  Ordered.  Started p.o. Lasix 40 mg daily.  HS Troponin x1 negative.  2.1 cm right apical lung nodule and 1.8 cm right paratracheal lymph node: Needs  to be followed up closely as outpatient as per radiology recommendations i.e. in 3 months, repeat CT chest or PET/CT or tissue sampling.  Body mass index is 37.96 kg/m./Obesity     DVT prophylaxis:   Eliquis anticoagulation   Code Status: Full Code:  Family Communication: None at bedside Disposition:  Status is: Inpatient Remains inpatient appropriate because: Need for further evaluation of left lung whiteout, remains on IV antibiotics.     Consultants:   PCCM  Procedures:     Antimicrobials:   As above   Subjective:  Seen this morning along with PCCM in ED room.  Sitting on chair.  3 to 4 days history of progressive dyspnea, mostly on mild exertion, dry cough, orthopnea-not positional, worsening leg edema.  Smokes cigars.  Objective:   Vitals:   02/05/22 0640 02/05/22 0658 02/05/22 0915 02/05/22 0945  BP: 106/67  103/67 107/85  Pulse: 98  100 (!) 102  Resp: 18  (!) 23 (!) 25  Temp:  98.1 F (36.7 C)    TempSrc:  Oral    SpO2: 92%  93% 93%  Weight:      Height:        General exam: Elderly male, moderately built and obese, somewhat unkempt, sitting up comfortably in reclining chair without distress. Respiratory system: Reduced breath sounds in the left lung base and on top of that, appreciated bronchial breath sounds.  Occasional right basal crackles but otherwise right lung fields clear to auscultation.  No increased work of breathing. Cardiovascular system: S1 & S2 heard, RRR. No JVD, murmurs, rubs, gallops or clicks.  2-3+ pitting bilateral leg edema up to the waist or even lower anterior abdominal wall. Gastrointestinal system: Abdomen is nondistended but obese with hanging pannus, soft and nontender. No organomegaly or masses felt. Normal bowel sounds heard. Central nervous system: Alert and oriented. No focal neurological deficits. Extremities: Symmetric 5 x 5 power. Skin: Erythema under abdominal pannus.  Fungal looking Psychiatry: Judgement and insight  appear normal. Mood & affect appropriate.     Data Reviewed:   I have personally reviewed following labs and imaging studies   CBC: Recent Labs  Lab 02/04/22 2040 02/04/22 2050 02/05/22 0239  WBC 13.8*  --  13.2*  NEUTROABS 10.7*  --   --   HGB 13.7 15.6  15.3 13.6  HCT 42.4 46.0  45.0 44.7  MCV 98.8  --  102.1*  PLT 229  --  858    Basic Metabolic Panel: Recent Labs  Lab 02/04/22 2040 02/04/22 2050 02/04/22 2358 02/05/22 0239  NA 139 136  136  --  138  K 5.1 4.8  4.8  --  4.6  CL 97* 95*  --  97*  CO2 29  --   --  30  GLUCOSE 390* 404*  --  289*  BUN 21 24*  --  19  CREATININE 0.92 0.80 0.88 0.85  CALCIUM 8.9  --   --  8.9    Liver Function Tests: Recent Labs  Lab 02/04/22 2040 02/05/22 0239  AST 18 14*  ALT 17 15  ALKPHOS 74 67  BILITOT 0.5 0.7  PROT 5.9* 5.8*  ALBUMIN 3.4* 3.5    CBG: Recent Labs  Lab 02/05/22 0045 02/05/22 0444 02/05/22 8502  GLUCAP 305* 220* 220*    Microbiology Studies:   Recent Results (from the past 240 hour(s))  Resp Panel by RT-PCR (Flu A&B, Covid) Anterior Nasal Swab     Status: None   Collection Time: 02/04/22  8:38 PM   Specimen: Anterior Nasal Swab  Result Value Ref Range Status   SARS Coronavirus 2 by RT PCR NEGATIVE NEGATIVE Final    Comment: (NOTE) SARS-CoV-2 target nucleic acids are NOT DETECTED.  The SARS-CoV-2 RNA is generally detectable in upper respiratory specimens during the acute phase of infection. The lowest concentration of SARS-CoV-2 viral copies this assay can detect is 138 copies/mL. A negative result does not preclude SARS-Cov-2 infection and should not be used as the sole basis for treatment or other patient management decisions. A negative result may occur with  improper specimen collection/handling, submission of specimen other than nasopharyngeal swab, presence of viral mutation(s) within the areas targeted by this assay, and inadequate number of viral copies(<138 copies/mL). A  negative result must be combined with clinical observations, patient history, and epidemiological information. The expected result is Negative.  Fact Sheet for Patients:  EntrepreneurPulse.com.au  Fact Sheet for Healthcare Providers:  IncredibleEmployment.be  This test is no t yet approved or cleared by the [place] FDA and  has been authorized for detection and/or diagnosis of SARS-CoV-2 by FDA under an Emergency Use Authorization (EUA). This EUA will remain  in effect (meaning this test can be used) for the duration of the COVID-19 declaration under Section 564(b)(1) of the Act, 21 U.S.C.section 360bbb-3(b)(1), unless the authorization is terminated  or revoked sooner.       Influenza A by PCR NEGATIVE NEGATIVE Final   Influenza B by PCR NEGATIVE NEGATIVE Final    Comment: (NOTE) The Xpert Xpress SARS-CoV-2/FLU/RSV plus assay is intended as an aid in the diagnosis of influenza from Nasopharyngeal swab specimens and should not be used as a sole basis for treatment. Nasal washings and aspirates are unacceptable for Xpert Xpress SARS-CoV-2/FLU/RSV testing.  Fact Sheet for Patients: EntrepreneurPulse.com.au  Fact Sheet for Healthcare Providers: IncredibleEmployment.be  This test is not yet approved or cleared by the [place] FDA and has been authorized for detection and/or diagnosis of SARS-CoV-2 by FDA under an Emergency Use Authorization (EUA). This EUA will remain in effect (meaning this test can be used) for the duration of the COVID-19 declaration under Section 564(b)(1) of the Act, 21 U.S.C. section 360bbb-3(b)(1), unless the authorization is terminated or revoked.  Performed at [facility], [place] [address] [place], [place] [address]     Radiology Studies:  CT CHEST WO CONTRAST  Result Date: 02/05/2022 CLINICAL DATA:  76 year old male with malignant pleural effusion  suspected. EXAM: CT CHEST WITHOUT CONTRAST TECHNIQUE: Multidetector CT imaging of the chest was performed following the standard protocol without IV contrast. RADIATION DOSE REDUCTION: This exam was performed according to the departmental dose-optimization program which includes automated exposure control, adjustment of the mA and/or kV according to patient size and/or use of iterative reconstruction technique. COMPARISON:  Chest radiographs 02/04/2022 and earlier. Report of [facility] chest CTA 03/11/2019 (no images available). FINDINGS: Cardiovascular: Extensive coronary artery calcified atherosclerosis and/or stent. Cardiac size remains within normal limits. No pericardial effusion. Calcified aortic atherosclerosis. Vascular patency is not evaluated in the absence of IV contrast. Mediastinum/Nodes: Enlarged but nonspecific 18 mm right paratracheal lymph node on series 2, image 41. Otherwise fairly small, mostly subcentimeter mediastinal lymph nodes are evident in the absence of contrast.  Hilar lymph nodes are not well evaluated. Lungs/Pleura: Complete left lower lobe combined collapse and consolidation. Opacified and atelectatic left mainstem bronchus on series 3, image 69. No significant left pleural effusion. Mediastinal shift to the left. Contralateral right lung confluent and dependent lower lobe opacity which could be atelectasis or consolidation. Less pronounced moderate right middle lobe atelectasis. Underlying right lung volume is low. But the major right lung airways remain patent. Superimposed spiculated right apical lung nodule is 2.1 cm on series 3, image 44. Upper Abdomen: Negative visible noncontrast liver, gallbladder, adrenal glands. Surgically absent spleen. Fatty atrophied pancreas. Calcified atherosclerosis including renal artery calcifications. Visible kidneys appear nonobstructed. No free air, free fluid, or dilated bowel in the visible upper abdomen. Musculoskeletal: Diffuse  idiopathic skeletal hyperostosis (DISH). Bulky anterior endplate osteophytes and ankylosis at the cervicothoracic junction. Flowing thoracic endplate osteophytes with multilevel thoracic interbody ankylosis. Bulky and severe degeneration at the left glenohumeral joint. Chronic left 1st through 3rd rib fractures. No destructive or suspicious osseous lesion identified. IMPRESSION: 1. No significant pleural effusion. Complete combined collapse and consolidation of the Left Lung with an atelectatic and opacified left mainstem bronchus. Less pronounced dependent Right Lower Lobe consolidation versus atelectasis. Aspiration is possible.  Pulmonary toilet may be valuable. 2. Superimposed indeterminate spiculated 2.1 cm right apical lung nodule and enlarged 1.8 cm right paratracheal lymph node. Recommend referral to [facility]). Consider one of the following in 3 months for both low-risk and high-risk individuals: (a) repeat chest CT, (b) follow-up PET-CT, or (c) tissue sampling. This recommendation follows the consensus statement: Guidelines for Management of Incidental Pulmonary Nodules Detected on CT Images: From the Fleischner Society 2017; Radiology 2017; 284:228-243. 3. Post splenectomy. Diffuse idiopathic skeletal hyperostosis (DISH). Extensive coronary artery calcified atherosclerosis or stent. Aortic Atherosclerosis (ICD10-I70.0). Electronically Signed   By: Genevie Ann M.D.   On: 02/05/2022 09:14   DG Chest 2 View  Result Date: 02/04/2022 CLINICAL DATA:  Chest pain.  Respiratory distress. EXAM: CHEST - 2 VIEW COMPARISON:  02/04/2022 FINDINGS: Shallow inspiration. There is diffuse white out of the left chest, progressing since previous study. This likely indicates either or combination of pleural effusion, atelectasis, and/or consolidation. There is diffuse perihilar infiltration in the right lung, also progressing. This could indicate edema or pneumonia. Aspiration would  also be a possibility. Heart size is obscured by the parenchymal process. No pneumothorax. IMPRESSION: Complete opacification of the left hemithorax with perihilar infiltration on the right, progressing since previous study. Electronically Signed   By: Lucienne Capers M.D.   On: 02/04/2022 22:23   DG Chest Port 1 View  Result Date: 02/04/2022 CLINICAL DATA:  SOB EXAM: PORTABLE CHEST 1 VIEW COMPARISON:  Chest x-ray 10/18/2020 FINDINGS: The heart and mediastinal contours are not well visualized due to patient positioning and lung findings. Consolidation of the majority of the left lung. Airspace and interstitial opacities of the right lung most prominent at the lung bases. Likely bilateral pleural effusions. No pneumothorax. No acute osseous abnormality. Likely old healed left rib fracture. Severe degenerative changes of the left shoulder. IMPRESSION: 1. Consolidation of the majority of the left lung. Airspace and interstitial opacities of the right lung most prominent at the lung bases. 2. Likely bilateral pleural effusions. 3. The heart and mediastinal contours are not well visualized due to patient positioning and lung findings. 4. Recommend repeat PA and lateral view of the chest with improved inspiratory effort and positioning. If CT chest obtained, please obtain with intravenous contrast.  Electronically Signed   By: Iven Finn M.D.   On: 02/04/2022 20:59    Scheduled Meds:    albuterol  2.5 mg Nebulization BID   apixaban  5 mg Oral BID   budesonide (PULMICORT) nebulizer solution  0.5 mg Nebulization BID   guaiFENesin  600 mg Oral BID   insulin aspart  0-20 Units Subcutaneous TID WC   insulin aspart  0-5 Units Subcutaneous QHS   insulin glargine-yfgn  30 Units Subcutaneous QHS   [START ON 02/06/2022] methylPREDNISolone (SOLU-MEDROL) injection  40 mg Intravenous Daily   sodium chloride HYPERTONIC  4 mL Nebulization BID    Continuous Infusions:    azithromycin     cefTRIAXone (ROCEPHIN)   IV 2 g (02/05/22 1027)   lactated ringers 100 mL/hr at 02/05/22 0137     LOS: 1 day     Vernell Leep, MD,  FACP, [facility], [facility], [facility], [facility]   [facility] [place]     To contact the attending provider between 7A-7P or the covering provider during after hours 7P-7A, please log into the web site [email or web address] and access using universal [place] password for that web site. If you do not have the password, please call the hospital operator.  02/05/2022, 10:37 AM

## 2022-02-05 NOTE — ED Notes (Signed)
CBG 220. 

## 2022-02-05 NOTE — Progress Notes (Signed)
Pharmacy Antibiotic Note  Jordan Wells is a 76 y.o. male admitted on 02/04/2022 with pneumonia.  Pharmacy has been consulted for Cefepime dosing. WBC 13.8. Renal function good.   Plan: Cefepime 2g IV q8h Trend WBC, temp, renal function  F/U infectious work-up   Temp (24hrs), Avg:97.9 F (36.6 C), Min:97.9 F (36.6 C), Max:97.9 F (36.6 C)  Recent Labs  Lab 02/04/22 2040 02/04/22 2050  WBC 13.8*  --   CREATININE 0.92 0.80    CrCl cannot be calculated (Unknown ideal weight.).    No Known Allergies  Narda Bonds, PharmD, BCPS Clinical Pharmacist Phone: [phone]

## 2022-02-05 NOTE — Progress Notes (Signed)
  Echocardiogram 2D Echocardiogram has been performed.  Jordan Wells 02/05/2022, 3:38 PM

## 2022-02-05 NOTE — Progress Notes (Signed)
RT attempted to place Pt on CPT vest but Pt refused at this time . RT will attempt again at another time.

## 2022-02-05 NOTE — Progress Notes (Signed)
Brief pulmonary progress note:  Contacted by ED doctor.  Reportedly patient has history of COPD and on 4 L nasal cannula baseline.  Currently on 4 L nasal cannula without any respiratory distress noted.  Saturations adequate.  Chest x-ray reveals left lung whiteout with tracheal deviation to the left indicative of volume loss or large volume atelectasis on my interpretation.  Recommendations: --Chest physiotherapy via percussion/bed --Tonic saline 3 times daily nebulized --Arformoterol twice daily, Yupelri daily, budesonide twice daily -all nebulized --Short acting bronchodilators as needed --We will place on list for formal and full pulmonary consult in the morning, may benefit from CT chest given full lung atelectasis to evaluate underlying mass lesion

## 2022-02-05 NOTE — ED Notes (Signed)
Transported to vascular. 

## 2022-02-06 ENCOUNTER — Inpatient Hospital Stay (HOSPITAL_COMMUNITY): Payer: Medicare Other

## 2022-02-06 ENCOUNTER — Encounter (HOSPITAL_COMMUNITY)
Admission: EM | Disposition: A | Discharge status: Home Health | Payer: Self-pay | Source: Home / Self Care | Attending: Internal Medicine

## 2022-02-06 DIAGNOSIS — R911 Solitary pulmonary nodule: Secondary | ICD-10-CM

## 2022-02-06 DIAGNOSIS — E11649 Type 2 diabetes mellitus with hypoglycemia without coma: Secondary | ICD-10-CM

## 2022-02-06 DIAGNOSIS — Z794 Long term (current) use of insulin: Secondary | ICD-10-CM

## 2022-02-06 LAB — BASIC METABOLIC PANEL
Anion gap: 13 (ref 5–15)
BUN: 21 mg/dL (ref 8–23)
CO2: 32 mmol/L (ref 22–32)
Calcium: 9 mg/dL (ref 8.9–10.3)
Chloride: 96 mmol/L — ABNORMAL LOW (ref 98–111)
Creatinine, Ser: 0.79 mg/dL (ref 0.61–1.24)
GFR, Estimated: >60 mL/min (ref >60)
Glucose, Bld: 44 mg/dL — CL (ref 70–99)
Potassium: 4 mmol/L (ref 3.5–5.1)
Sodium: 141 mmol/L (ref 135–145)

## 2022-02-06 LAB — GLUCOSE, CAPILLARY
Glucose-Capillary: 47 mg/dL — ABNORMAL LOW (ref 70–99)
Glucose-Capillary: 64 mg/dL — ABNORMAL LOW (ref 70–99)
Glucose-Capillary: 88 mg/dL (ref 70–99)
Glucose-Capillary: 141 mg/dL — ABNORMAL HIGH (ref 70–99)
Glucose-Capillary: 194 mg/dL — ABNORMAL HIGH (ref 70–99)
Glucose-Capillary: 152 mg/dL — ABNORMAL HIGH (ref 70–99)
Glucose-Capillary: 290 mg/dL — ABNORMAL HIGH (ref 70–99)
Glucose-Capillary: 254 mg/dL — ABNORMAL HIGH (ref 70–99)

## 2022-02-06 LAB — PROCALCITONIN: Procalcitonin: 0.12 ng/mL

## 2022-02-06 SURGERY — VIDEO BRONCHOSCOPY WITHOUT FLUORO
Anesthesia: General

## 2022-02-06 MED ORDER — GLUCOSE 40 % PO GEL: 1.0000 | Freq: Once | ORAL | Status: DC | PRN

## 2022-02-06 MED ORDER — MELATONIN 5 MG PO TABS
10.0000 mg | ORAL_TABLET | Freq: Every evening | ORAL | Status: DC | PRN
  Administered 2022-02-06 – 2022-02-08 (×2): 10 mg via ORAL
  Filled 2022-02-06 (×2): qty 2

## 2022-02-06 MED ORDER — FUROSEMIDE 10 MG/ML IJ SOLN
40.0000 mg | Freq: Every day | INTRAMUSCULAR | Status: DC
  Administered 2022-02-06: 40 mg via INTRAVENOUS
  Filled 2022-02-06: qty 4

## 2022-02-06 MED ORDER — METOPROLOL TARTRATE 12.5 MG HALF TABLET
12.5000 mg | ORAL_TABLET | Freq: Every morning | ORAL | Status: DC
  Administered 2022-02-07 – 2022-02-10 (×4): 12.5 mg via ORAL
  Filled 2022-02-06 (×4): qty 1

## 2022-02-06 MED ORDER — INSULIN ASPART 100 UNIT/ML IJ SOLN
0.0000 [IU] | INTRAMUSCULAR | Status: DC
  Administered 2022-02-06 (×2): 2 [IU] via SUBCUTANEOUS
  Administered 2022-02-06 (×2): 5 [IU] via SUBCUTANEOUS
  Administered 2022-02-06: 1 [IU] via SUBCUTANEOUS
  Administered 2022-02-07 (×2): 3 [IU] via SUBCUTANEOUS
  Administered 2022-02-07: 5 [IU] via SUBCUTANEOUS
  Administered 2022-02-07 – 2022-02-08 (×4): 2 [IU] via SUBCUTANEOUS
  Administered 2022-02-08 (×2): 3 [IU] via SUBCUTANEOUS
  Administered 2022-02-08: 7 [IU] via SUBCUTANEOUS
  Administered 2022-02-09: 1 [IU] via SUBCUTANEOUS
  Administered 2022-02-09: 2 [IU] via SUBCUTANEOUS

## 2022-02-06 MED ORDER — ATORVASTATIN CALCIUM 80 MG PO TABS
80.0000 mg | ORAL_TABLET | Freq: Every day | ORAL | Status: DC
  Administered 2022-02-06 – 2022-02-09 (×4): 80 mg via ORAL
  Filled 2022-02-06: qty 1
  Filled 2022-02-06: qty 2
  Filled 2022-02-06 (×2): qty 1

## 2022-02-06 MED ORDER — NYSTATIN 100000 UNIT/GM EX POWD
Freq: Two times a day (BID) | CUTANEOUS | Status: DC
  Filled 2022-02-06 (×2): qty 15

## 2022-02-06 NOTE — Plan of Care (Signed)

## 2022-02-06 NOTE — Inpatient Diabetes Management (Addendum)
Inpatient Diabetes Program Recommendations  AACE/ADA: New Consensus Statement on Inpatient Glycemic Control (2015)  Target Ranges:  Prepandial:   less than 140 mg/dL      Peak postprandial:   less than 180 mg/dL (1-2 hours)      Critically ill patients:  140 - 180 mg/dL    Latest Reference Range & Units 02/05/22 01:42  Hemoglobin A1C 4.8 - 5.6 % 11.5 (H)  283 mg/dl  (H): Data is abnormally high  Latest Reference Range & Units 02/04/22 20:49 02/05/22 00:45 02/05/22 04:44 02/05/22 07:22 02/05/22 13:22 02/05/22 16:48 02/05/22 21:03  Glucose-Capillary 70 - 99 mg/dL 352 (H) 305 (H)  4 units Novolog  30 units Semglee $RemoveBefor'@[id number]$   220 (H) 220 (H)  7 units Novolog  291 (H)  15 units Novolog  142 (H)  7 units Novolog  183 (H)    30 units Semglee  (H): Data is abnormally high  Latest Reference Range & Units 02/06/22 05:49 02/06/22 06:15 02/06/22 06:42 02/06/22 08:21  Glucose-Capillary 70 - 99 mg/dL 47 (L) 64 (L) 88 141 (H)  1 unit Novolog   (L): Data is abnormally low (H): Data is abnormally high   Admit with:  acute on chronic respiratory failure with hypoxemia left lung opacification with mucous plugging  History: DM  Home DM Meds: Lantus 30 units BID        Metformin 500 mg BID  Current Orders: Novolog Sensitive Correction Scale/ SSI (0-9 units) Q4 hours    Severe Hypoglycemia this AM--Note Semglee d/c'd this AM--Novolog Meal Coverage also d/c'd this AM  Pt getting Solumedrol 40 mg Daily   MD- Note Semglee was discontinued  If CBGs begin to rise, may consider adding back Semglee at 50% home dose (15 units BID)   Addendum 11:20am--Met w/ pt at bedside.  Son was on phone to help answer questions as well.  Pt told me [facility]  She prescribes his diabetes meds.  Pt told me he is taking Lantus 52 units Once Daily (NOT 30 units BID as listed in the Home Med Rec) and pt also told me he is NOT taking the Metformin anymore b/c it gave him  diarrhea.  Only checking CBGs Once per week.  When I inquired why pt only checking CBGs once weekly, he stated to me "why bother checking 5 times a day like they tell me to? If they're high what am I supposed to do--call the [place] and wait for 3 hours on the phone!".  Then he proceeded to tell me his CBG meter is a "piece of junk" b/c he'll check his CBG and it will say "High".  I tried to explain to pt that if his meter is reading "high" that his CBG is likely too high for the meter to measure an accurate reading and that he is likely having issues with hyperglycemia at home and likely needs to be seen by his PCP at the [facility].  Pt said "yeah sure" and that was the end of that piece of the conversation.  I tried to convey to pt the importance of checking CBGs more than once a week and asked him to try and check at least once a day and vary the time he checks (pre-meals or 2-hr post-meals).  Reviewed current A1c of 11.5% with pt and son.  Son told me pt's l;ast A1c was in the 11% range.  I tried to discuss with pt the importance of better CBG  control to help prevent long-term complications, however, pt appeared severely disinterested in anything I had to say.  Asked pt if he had any questions about nutrition at home and pt stated he did not.  Has Freestyle meter and Lantus insulin at home.    --Will follow patient during hospitalization--  Wyn Quaker RN, MSN, [place] Diabetes Coordinator Inpatient Glycemic Control Team Team Pager: [phone] (8a-5p)

## 2022-02-06 NOTE — Progress Notes (Addendum)
NAME:  Jordan Wells, MRN:  [id number], DOB:  11/28/1945, LOS: 2 ADMISSION DATE:  02/04/2022, CONSULTATION DATE:  10/22 REFERRING MD:  Dr. Algis Liming, CHIEF COMPLAINT:  SOB   History of Present Illness:  76 y/o M who presented to [facility] ER on 10/21 with reports of SOB.  The patient reported 3 days of SOB.  He was in tripod position on EMS arrival.  At baseline he is 4L O2 dependent and required an increase to 6L to maintain saturations.  He was in atrial fibrillation on arrival, rate controlled.  Initial evaluation notable for decreased breath sounds on left.  Initial CXR showed opacification on the left side - layering effusion vs mucus plugging.  COVID/influenza screening negative.  Initial labs- Na 138, K 4.6, Cl 97, CO2 30, glucose 289, BUN 19, cr 0.85, WBC 13.2, Hgb 13.6, platelets 227.  The patient was admitted for further care.   PCCM consulted for pulmonary evaluation.   Pt reports he has difficulty lying flat due to SOB.  He uses 4L O2 at baseline. Has felt more SOB for the last 4 days. He is unaware of why he had a pleural effusion with interventions in the past.  States he has a hx of an irregular heart rhythm / murmur and is on anticoagulation.   Pertinent  Medical History  CAD s/p MI HTN HLD  DM  Chronic Diastolic CHF  4L O2 Dependent COPD Cigar Smoker Pleural Effusion - 02/2021, s/p bronch, thoracoscopy, pleural biopsy, pleurX cath.  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 Pleural fluid negative for malignant cells.  COVID -19 - 09/2020  Fournier Gangrene - 2020 Cellulitis of LE's Non-Hodgkin's Lymphoma   Significant Hospital Events: Including procedures, antibiotic start and stop dates in addition to other pertinent events   10/21 Admit with SOB, LLL atelectasis/PAN 10/22 pulm consult. Incr pulm hygiene 10/23 possible bronch pending CXR  Interim History / Subjective:   NAEO Feels like the nebs make him cough more   Son Jimmy at bedside    Objective   Blood pressure (!) 143/81, pulse 100, temperature 97.8 F (36.6 C), temperature source Oral, resp. rate 20, height '5\' 10"'$  (1.778 m), weight 120 kg, SpO2 94 %.        Intake/Output Summary (Last 24 hours) at 02/06/2022 1251 Last data filed at 02/06/2022 0334 Gross per 24 hour  Intake 233.33 ml  Output 300 ml  Net -66.67 ml   Filed Weights   02/05/22 0305  Weight: 120 kg    Examination: General: Chronically ill older adult M NAD  HENT: NCAT pink mm  Lungs: Even unlabored. Poor air movement, diminished.  Cardiovascular: irir. Cap refill < 3 sec  Abdomen:  obese, soft  Extremities: BLE 2+ edema.  Neuro: AAOx4 following commands   Resolved Hospital Problem list     Assessment & Plan:   Recurrent L pleural effusion LLL atelectasis vs CAP  RUL lung nodule, right paratracheal lymphadenopathy  Tobacco use disorder COPD Chronic hypoxic respiratory failure  Patient with hx of the same, s/p placement of pleurx catheter in 2022.  Catheter fell out.  Pleural fluid cytology was negative. Pleural biopsy negative as well. Pleural fluid culture was negative at that time. It was thought he had entrapped lung.  Sounds like on [place] there wasn't a great fluid pocket P -cont CAP coverage -- short course ok -On pred 40 -- consider tapering further tomorrow -cont hypertonic nebs, mucinex -cont pulmicort, brovana, PRN albuterol  -  CXR 10/23 pending, d/w CCM MD re bronch 10/23 vs 10.24 -holding AC for possible bronch and is NPO at this time, though had a few sips of coffee/water + 2 bites of grits 10/23 AM  AF Rate controlled  -per [facility], holding AC as above    DM II -per [facility]   Best Practice (right click and "Reselect all SmartList Selections" daily)  Per [facility]   Labs   CBC: Recent Labs  Lab 02/04/22 2040 02/04/22 2050 02/05/22 0239  WBC 13.8*  --  13.2*  NEUTROABS 10.7*  --   --   HGB 13.7 15.6  15.3 13.6  HCT 42.4 46.0  45.0 44.7  MCV 98.8  --  102.1*  PLT 229   --  878    Basic Metabolic Panel: Recent Labs  Lab 02/04/22 2040 02/04/22 2050 02/04/22 2358 02/05/22 0239 02/06/22 0400  NA 139 136  136  --  138 141  K 5.1 4.8  4.8  --  4.6 4.0  CL 97* 95*  --  97* 96*  CO2 29  --   --  30 32  GLUCOSE 390* 404*  --  289* 44*  BUN 21 24*  --  19 21  CREATININE 0.92 0.80 0.88 0.85 0.79  CALCIUM 8.9  --   --  8.9 9.0   GFR: Estimated Creatinine Clearance: 102 mL/min (by C-G formula based on SCr of 0.79 mg/dL). Recent Labs  Lab 02/04/22 2040 02/05/22 0239 02/06/22 0608  PROCALCITON  --  <0.10 0.12  WBC 13.8* 13.2*  --     Liver Function Tests: Recent Labs  Lab 02/04/22 2040 02/05/22 0239  AST 18 14*  ALT 17 15  ALKPHOS 74 67  BILITOT 0.5 0.7  PROT 5.9* 5.8*  ALBUMIN 3.4* 3.5   No results for input(s): "LIPASE", "AMYLASE" in the last 168 hours. No results for input(s): "AMMONIA" in the last 168 hours.  ABG    Component Value Date/Time   HCO3 32.7 (H) 02/04/2022 2050   TCO2 32 02/04/2022 2050   TCO2 34 (H) 02/04/2022 2050   O2SAT 95 02/04/2022 2050     Coagulation Profile: No results for input(s): "INR", "PROTIME" in the last 168 hours.  Cardiac Enzymes: No results for input(s): "CKTOTAL", "CKMB", "CKMBINDEX", "TROPONINI" in the last 168 hours.  HbA1C: Hgb A1c MFr Bld  Date/Time Value Ref Range Status  02/05/2022 01:42 AM 11.5 (H) 4.8 - 5.6 % Final    Comment:    (NOTE) Pre diabetes:          5.7%-6.4%  Diabetes:              >6.4%  Glycemic control for   <7.0% adults with diabetes     CBG: Recent Labs  Lab 02/06/22 0549 02/06/22 0615 02/06/22 0642 02/06/22 0821 02/06/22 1227  GLUCAP 47* 57* 88 141* 194*    Eliseo Gum MSN, AGACNP-BC [place] for pager 02/06/2022, 12:51 PM

## 2022-02-06 NOTE — Progress Notes (Signed)
  Transition of Care [facility]) Screening Note   Patient Details  Name: Vaibhav Fogleman Date of Birth: May 12, 1945   Transition of Care [facility]) CM/SW Contact:    Cyndi Bender, RN Phone Number: 02/06/2022, 9:23 AM    Transition of Care Department [facility]) has reviewed patient and no TOC needs have been identified at this time. We will continue to monitor patient advancement through interdisciplinary progression rounds. If new patient transition needs arise, please place a TOC consult.

## 2022-02-06 NOTE — Progress Notes (Addendum)
PROGRESS NOTE   Jordan Wells  ZOX:[id number]    DOB: [birth year]    DOA: 02/04/2022  PCP: Delorise Shiner, MD   I have briefly reviewed patients previous medical records in [facility].  Chief Complaint  Patient presents with   Respiratory Distress    Brief Narrative:  76 year old male, 76 year old male, lives with his son, ambulates with the help of walker or cane, PMH of chronic respiratory failure with hypoxia on home oxygen 4 L/min continuously, CAD s/p MI, HTN, HLD, DM, chronic diastolic CHF, COPD, tobacco use/cigars, recurrent left pleural effusion 02/2021, s/p bronchoscopy, thoracoscopy, pleural biopsy, Pleurx catheter placement at [facility], pleural fluid negative for malignant cells per PCCM, NHL s/p splenectomy and no chemotherapy or radiation, prolonged COVID-19 infection, presented to the ED on 02/04/2022 with complaints of 3 to 4 days history of progressive dyspnea, nonproductive cough, chronic orthopnea, worsening bilateral lower extremity swelling.  With his usual home oxygen, oxygen saturations were 89% and he increased home oxygen to 6 L/min.  On EMS arrival, in tripod position, noted to be in A-fib-rate controlled.  Admitted for acute on chronic respiratory failure with hypoxia, multifactorial due to whiteout of left lung on chest x-ray, left lower lobe atelectasis versus CAP versus recurrent left pleural effusion.  PCCM consulted and are considering bronchoscopy.  Assessment & Plan:  Principal Problem:   Acute on chronic respiratory failure with hypoxemia (HCC) Active Problems:   Diabetes mellitus type 2 in obese (HCC)   CAD (coronary artery disease)   Chronic atrial fibrillation (HCC)   Morbid obesity (HCC)   Hypoxia   Other hyperlipidemia   Lung nodule   Acute on chronic respiratory failure with hypoxia: On home oxygen 4 L/min.  This had increased to a peak of 6 L/min.  Etiology has noted below.  Incentive spirometry.  Flutter valve.  Currently on 5  L/min.  Left lower lobe atelectasis Vs CAP Vs R/O obstructing lung mass: Whiteout of left lung fields on chest x-ray.  PCCM input appreciated. CT chest 10/22: No significant pleural effusion.  Complete combined collapse and consolidation of the left lung with an atelectatic and opacified left mainstem bronchus.  Less pronounced dependent right lower lobe consolidation versus atelectasis. Bedside ultrasound in ED by PCCM, without significant fluid to tap but noted consolidation.  Continue IV ceftriaxone, azithromycin, hypertonic saline nebs.  Procalcitonin negative, but defer antibiotic management to PCCM.  Follow intermittent chest x-ray.  Incentive spirometry.  PCCM considering bronchoscopy, Eliquis thereby on hold.  Recurrent left pleural effusion: As per PCCM, history of same in 02/2021, had Pleurx catheter placed, catheter fell out, pleural fluid cytology/biopsy and culture were negative.  It was felt that he had entrapped lung.  Bedside ultrasound without fluid to tap.  CT and bronchoscopy plans as above.  COPD: No clinical bronchospasm.  Continue Pulmicort, Brovana, albuterol.  Aim for O2 saturation of 88-94%.  IV Solu-Medrol reduced to 40 mg daily-defer steroids management to PCCM.  Paroxysmal A-fib: Rate controlled.  Apixaban on hold for possible bronchoscopy.  Continue prior home dose of metoprolol 12.5 Mg daily  Essential hypertension: Controlled.  Continue prior home dose of metoprolol 12.5 Mg daily.  Type II DM: A1c of 11.5 suggests poor outpatient control.  Steroids likely to make it worse.  Was on Lantus 30 units at bedtime here, got a dose last night, developed hypoglycemia in the 40s this morning, Lantus and mealtime NovoLog discontinued.  As per DM coordinator input and my review with patient, he was  taking Lantus 52 units daily and was not checking CBGs regularly.  Not sure he was compliant with diet either (ate bacon and eggs).  Consider resuming Semglee at 12 units twice daily (50% of  home dose), if blood sugar starting to climb this evening or can consider starting tomorrow.  Hyperlipidemia: Continue atorvastatin.  Anasarca/bilateral lower extremity edema/chronic diastolic CHF: HS Troponin x1 negative.  At home on Lasix 20 mg twice daily.  Started Lasix 40 mg daily without significant diuresis.  TTE: LVEF 55-60%.  Changed to IV Lasix 40 mg daily.  2.1 cm right apical lung nodule and 1.8 cm right paratracheal lymph node: Needs to be followed up closely as outpatient as per radiology recommendations i.e. in 3 months, repeat CT chest or PET/CT or tissue sampling.  As noted above, PCCM considering bronchoscopy.  Body mass index is 37.96 kg/m./Obesity     DVT prophylaxis: Place TED hose Start: 02/05/22 1049  Eliquis anticoagulation on hold   Code Status: Full Code:  Family Communication: None at bedside Disposition:  Status is: Inpatient Remains inpatient appropriate because: Need for further evaluation of left lung whiteout, remains on IV antibiotics.  Bronchoscopy     Consultants:   PCCM  Procedures:     Antimicrobials:   As above   Subjective:  Seen this morning.  Wrapped up in blanket and sleeping.  Woke up but poor historian.  States that his dyspnea is improved and may be close to baseline.  Indicates that he takes Lantus 52 units daily and eats "bacon and eggs".  Objective:   Vitals:   02/06/22 0825 02/06/22 0828 02/06/22 0849 02/06/22 1200  BP:  128/89  (!) 143/81  Pulse:  100  100  Resp:    20  Temp:  97.8 F (36.6 C)  97.8 F (36.6 C)  TempSrc:  Oral  Oral  SpO2: 93% 96% 96% 94%  Weight:      Height:        General exam: Elderly male, moderately built and obese, somewhat unkempt, sitting up comfortably in reclining chair without distress. Respiratory system: Reduced breath sounds in the left lung base and on top of that, appreciated bronchial breath sounds.  Occasional right basal crackles but otherwise right lung fields clear to  auscultation.  No increased work of breathing.  No significant change compared to yesterday. Cardiovascular system: S1 & S2 heard, RRR. No JVD, murmurs, rubs, gallops or clicks.  2-3+ pitting bilateral leg edema up to the waist or even lower anterior abdominal wall.  No significant change compared to yesterday.  Telemetry personally reviewed: A-fib with controlled ventricular rate. Gastrointestinal system: Abdomen is nondistended but obese with hanging pannus, soft and nontender. No organomegaly or masses felt. Normal bowel sounds heard. Central nervous system: Alert and oriented. No focal neurological deficits. Extremities: Symmetric 5 x 5 power. Skin: Erythema under abdominal pannus.  Fungal looking Psychiatry: Judgement and insight appear normal. Mood & affect appropriate.     Data Reviewed:   I have personally reviewed following labs and imaging studies   CBC: Recent Labs  Lab 02/04/22 2040 02/04/22 2050 02/05/22 0239  WBC 13.8*  --  13.2*  NEUTROABS 10.7*  --   --   HGB 13.7 15.6  15.3 13.6  HCT 42.4 46.0  45.0 44.7  MCV 98.8  --  102.1*  PLT 229  --  665    Basic Metabolic Panel: Recent Labs  Lab 02/04/22 2040 02/04/22 2050 02/04/22 2358 02/05/22 0239 02/06/22 0400  NA 139 136  136  --  138 141  K 5.1 4.8  4.8  --  4.6 4.0  CL 97* 95*  --  97* 96*  CO2 29  --   --  30 32  GLUCOSE 390* 404*  --  289* 44*  BUN 21 24*  --  19 21  CREATININE 0.92 0.80 0.88 0.85 0.79  CALCIUM 8.9  --   --  8.9 9.0    Liver Function Tests: Recent Labs  Lab 02/04/22 2040 02/05/22 0239  AST 18 14*  ALT 17 15  ALKPHOS 74 67  BILITOT 0.5 0.7  PROT 5.9* 5.8*  ALBUMIN 3.4* 3.5    CBG: Recent Labs  Lab 02/06/22 0642 02/06/22 0821 02/06/22 1227  GLUCAP 88 141* 194*    Microbiology Studies:   Recent Results (from the past 240 hour(s))  Resp Panel by RT-PCR (Flu A&B, Covid) Anterior Nasal Swab     Status: None   Collection Time: 02/04/22  8:38 PM   Specimen: Anterior  Nasal Swab  Result Value Ref Range Status   SARS Coronavirus 2 by RT PCR NEGATIVE NEGATIVE Final    Comment: (NOTE) SARS-CoV-2 target nucleic acids are NOT DETECTED.  The SARS-CoV-2 RNA is generally detectable in upper respiratory specimens during the acute phase of infection. The lowest concentration of SARS-CoV-2 viral copies this assay can detect is 138 copies/mL. A negative result does not preclude SARS-Cov-2 infection and should not be used as the sole basis for treatment or other patient management decisions. A negative result may occur with  improper specimen collection/handling, submission of specimen other than nasopharyngeal swab, presence of viral mutation(s) within the areas targeted by this assay, and inadequate number of viral copies(<138 copies/mL). A negative result must be combined with clinical observations, patient history, and epidemiological information. The expected result is Negative.  Fact Sheet for Patients:  EntrepreneurPulse.com.au  Fact Sheet for Healthcare Providers:  IncredibleEmployment.be  This test is no t yet approved or cleared by the [place] FDA and  has been authorized for detection and/or diagnosis of SARS-CoV-2 by FDA under an Emergency Use Authorization (EUA). This EUA will remain  in effect (meaning this test can be used) for the duration of the COVID-19 declaration under Section 564(b)(1) of the Act, 21 U.S.C.section 360bbb-3(b)(1), unless the authorization is terminated  or revoked sooner.       Influenza A by PCR NEGATIVE NEGATIVE Final   Influenza B by PCR NEGATIVE NEGATIVE Final    Comment: (NOTE) The Xpert Xpress SARS-CoV-2/FLU/RSV plus assay is intended as an aid in the diagnosis of influenza from Nasopharyngeal swab specimens and should not be used as a sole basis for treatment. Nasal washings and aspirates are unacceptable for Xpert Xpress SARS-CoV-2/FLU/RSV testing.  Fact Sheet for  Patients: EntrepreneurPulse.com.au  Fact Sheet for Healthcare Providers: IncredibleEmployment.be  This test is not yet approved or cleared by the [place] FDA and has been authorized for detection and/or diagnosis of SARS-CoV-2 by FDA under an Emergency Use Authorization (EUA). This EUA will remain in effect (meaning this test can be used) for the duration of the COVID-19 declaration under Section 564(b)(1) of the Act, 21 U.S.C. section 360bbb-3(b)(1), unless the authorization is terminated or revoked.  Performed at [facility], [place] [address], [place], [place] [address]     Radiology Studies:  DG CHEST PORT 1 VIEW  Result Date: 02/06/2022 CLINICAL DATA:  Left lung atelectasis EXAM: PORTABLE CHEST 1 VIEW COMPARISON:  Chest  x-ray dated February 04, 2022 FINDINGS: Cardiac and mediastinal contours are not visualized. Unchanged complete opacification of the left hemithorax. Small right pleural effusion and right basilar atelectasis. No evidence of pneumothorax. IMPRESSION: Unchanged complete opacification of the left hemithorax, likely due to left lung collapse. Electronically Signed   By: Yetta Glassman M.D.   On: 02/06/2022 14:58   ECHOCARDIOGRAM COMPLETE  Result Date: 02/05/2022    ECHOCARDIOGRAM REPORT   Patient Name:   Jordan Wells Date of Exam: 02/05/2022 Medical Rec #:  [id number]      Height:       70.0 in Accession #:    [id number]     Weight:       264.6 lb Date of Birth:  [birth year]      BSA:          2.351 m Patient Age:    39 years       BP:           115/82 mmHg Patient Gender: M              HR:           97 bpm. Exam Location:  Inpatient Procedure: 2D Echo, Color Doppler and Cardiac Doppler Indications:    congestive heart failure  History:        Patient has prior history of Echocardiogram examinations. CAD,                 Arrythmias:Atrial Fibrillation; Risk Factors:Diabetes and                 Dyslipidemia.  Sonographer:     Johny Chess RDCS Referring Phys: [id number] Hellena Pridgen D Keleigh Kazee  Sonographer Comments: Technically difficult study due to poor echo windows, suboptimal subcostal window and patient is obese. Image acquisition challenging due to patient body habitus and Image acquisition challenging due to respiratory motion. IMPRESSIONS  1. Left ventricular ejection fraction, by estimation, is 55 to 60%. The left ventricle has normal function. The left ventricle has no regional wall motion abnormalities. There is mild left ventricular hypertrophy. Left ventricular diastolic parameters are indeterminate.  2. Right ventricular systolic function was not well visualized. The right ventricular size is not well visualized. Tricuspid regurgitation signal is inadequate for assessing PA pressure.  3. The mitral valve was not well visualized. No evidence of mitral valve regurgitation. No evidence of mitral stenosis.  4. The aortic valve was not well visualized. There is mild calcification of the aortic valve. There is mild thickening of the aortic valve. Aortic valve regurgitation is not visualized. Mild aortic valve stenosis. Aortic valve mean gradient measures 9.5  mmHg. Aortic valve peak gradient measures 17.1 mmHg. Aortic valve area, by VTI measures 1.55 cm.  5. Aortic dilatation noted. There is mild dilatation of the ascending aorta, measuring 36 mm. FINDINGS  Left Ventricle: Left ventricular ejection fraction, by estimation, is 55 to 60%. The left ventricle has normal function. The left ventricle has no regional wall motion abnormalities. Definity contrast agent was given IV to delineate the left ventricular  endocardial borders. The left ventricular internal cavity size was normal in size. There is mild left ventricular hypertrophy. Left ventricular diastolic parameters are indeterminate. Right Ventricle: The right ventricular size is not well visualized. Right vetricular wall thickness was not well visualized. Right ventricular systolic  function was not well visualized. Tricuspid regurgitation signal is inadequate for assessing PA pressure. Left Atrium: Left atrial size was not well visualized. Right Atrium: Right atrial size was  not well visualized. Pericardium: The pericardium was not well visualized. Mitral Valve: The mitral valve was not well visualized. There is mild thickening of the mitral valve leaflet(s). There is mild calcification of the mitral valve leaflet(s). Mild mitral annular calcification. No evidence of mitral valve regurgitation. No evidence of mitral valve stenosis. MV peak gradient, 9.1 mmHg. The mean mitral valve gradient is 3.0 mmHg. Tricuspid Valve: The tricuspid valve is not well visualized. Tricuspid valve regurgitation is not demonstrated. No evidence of tricuspid stenosis. Aortic Valve: The aortic valve was not well visualized. There is mild calcification of the aortic valve. There is mild thickening of the aortic valve. There is mild aortic valve annular calcification. Aortic valve regurgitation is not visualized. Mild aortic stenosis is present. Aortic valve mean gradient measures 9.5 mmHg. Aortic valve peak gradient measures 17.1 mmHg. Aortic valve area, by VTI measures 1.55 cm. Pulmonic Valve: The pulmonic valve was not well visualized. Pulmonic valve regurgitation is not visualized. No evidence of pulmonic stenosis. Aorta: The aortic root is normal in size and structure and aortic dilatation noted. There is mild dilatation of the ascending aorta, measuring 36 mm. Venous: The inferior vena cava was not well visualized. IAS/Shunts: The interatrial septum was not well visualized.  LEFT VENTRICLE PLAX 2D LVIDd:         4.50 cm LVIDs:         3.50 cm LV PW:         1.20 cm LV IVS:        1.00 cm LVOT diam:     2.40 cm LV SV:         53 LV SV Index:   22 LVOT Area:     4.52 cm  RIGHT VENTRICLE TAPSE (M-mode): 1.9 cm LEFT ATRIUM           Index        RIGHT ATRIUM           Index LA diam:      4.20 cm 1.79 cm/m   RA  Area:     27.80 cm LA Vol (A4C): 99.0 ml 42.11 ml/m  RA Volume:   99.30 ml  42.24 ml/m  AORTIC VALVE AV Area (Vmax):    1.55 cm AV Area (Vmean):   1.46 cm AV Area (VTI):     1.55 cm AV Vmax:           207.00 cm/s AV Vmean:          141.000 cm/s AV VTI:            0.340 m AV Peak Grad:      17.1 mmHg AV Mean Grad:      9.5 mmHg LVOT Vmax:         70.70 cm/s LVOT Vmean:        45.367 cm/s LVOT VTI:          0.116 m LVOT/AV VTI ratio: 0.34  AORTA Ao Root diam: 3.60 cm Ao Asc diam:  3.60 cm MITRAL VALVE MV Area VTI:  1.72 cm   SHUNTS MV Peak grad: 9.1 mmHg   Systemic VTI:  0.12 m MV Mean grad: 3.0 mmHg   Systemic Diam: 2.40 cm MV Vmax:      1.51 m/s MV Vmean:     67.9 cm/s Carlyle Dolly MD Electronically signed by Carlyle Dolly MD Signature Date/Time: 02/05/2022/4:11:54 PM    Final    CT CHEST WO CONTRAST  Result Date: 02/05/2022 CLINICAL DATA:  76 year old  male with malignant pleural effusion suspected. EXAM: CT CHEST WITHOUT CONTRAST TECHNIQUE: Multidetector CT imaging of the chest was performed following the standard protocol without IV contrast. RADIATION DOSE REDUCTION: This exam was performed according to the departmental dose-optimization program which includes automated exposure control, adjustment of the mA and/or kV according to patient size and/or use of iterative reconstruction technique. COMPARISON:  Chest radiographs 02/04/2022 and earlier. Report of [facility] chest CTA 03/11/2019 (no images available). FINDINGS: Cardiovascular: Extensive coronary artery calcified atherosclerosis and/or stent. Cardiac size remains within normal limits. No pericardial effusion. Calcified aortic atherosclerosis. Vascular patency is not evaluated in the absence of IV contrast. Mediastinum/Nodes: Enlarged but nonspecific 18 mm right paratracheal lymph node on series 2, image 41. Otherwise fairly small, mostly subcentimeter mediastinal lymph nodes are evident in the absence of contrast. Hilar lymph  nodes are not well evaluated. Lungs/Pleura: Complete left lower lobe combined collapse and consolidation. Opacified and atelectatic left mainstem bronchus on series 3, image 69. No significant left pleural effusion. Mediastinal shift to the left. Contralateral right lung confluent and dependent lower lobe opacity which could be atelectasis or consolidation. Less pronounced moderate right middle lobe atelectasis. Underlying right lung volume is low. But the major right lung airways remain patent. Superimposed spiculated right apical lung nodule is 2.1 cm on series 3, image 44. Upper Abdomen: Negative visible noncontrast liver, gallbladder, adrenal glands. Surgically absent spleen. Fatty atrophied pancreas. Calcified atherosclerosis including renal artery calcifications. Visible kidneys appear nonobstructed. No free air, free fluid, or dilated bowel in the visible upper abdomen. Musculoskeletal: Diffuse idiopathic skeletal hyperostosis (DISH). Bulky anterior endplate osteophytes and ankylosis at the cervicothoracic junction. Flowing thoracic endplate osteophytes with multilevel thoracic interbody ankylosis. Bulky and severe degeneration at the left glenohumeral joint. Chronic left 1st through 3rd rib fractures. No destructive or suspicious osseous lesion identified. IMPRESSION: 1. No significant pleural effusion. Complete combined collapse and consolidation of the Left Lung with an atelectatic and opacified left mainstem bronchus. Less pronounced dependent Right Lower Lobe consolidation versus atelectasis. Aspiration is possible.  Pulmonary toilet may be valuable. 2. Superimposed indeterminate spiculated 2.1 cm right apical lung nodule and enlarged 1.8 cm right paratracheal lymph node. Recommend referral to [facility]). Consider one of the following in 3 months for both low-risk and high-risk individuals: (a) repeat chest CT, (b) follow-up PET-CT, or (c) tissue sampling. This  recommendation follows the consensus statement: Guidelines for Management of Incidental Pulmonary Nodules Detected on CT Images: From the Fleischner Society 2017; Radiology 2017; 284:228-243. 3. Post splenectomy. Diffuse idiopathic skeletal hyperostosis (DISH). Extensive coronary artery calcified atherosclerosis or stent. Aortic Atherosclerosis (ICD10-I70.0). Electronically Signed   By: Genevie Ann M.D.   On: 02/05/2022 09:14   DG Chest 2 View  Result Date: 02/04/2022 CLINICAL DATA:  Chest pain.  Respiratory distress. EXAM: CHEST - 2 VIEW COMPARISON:  02/04/2022 FINDINGS: Shallow inspiration. There is diffuse white out of the left chest, progressing since previous study. This likely indicates either or combination of pleural effusion, atelectasis, and/or consolidation. There is diffuse perihilar infiltration in the right lung, also progressing. This could indicate edema or pneumonia. Aspiration would also be a possibility. Heart size is obscured by the parenchymal process. No pneumothorax. IMPRESSION: Complete opacification of the left hemithorax with perihilar infiltration on the right, progressing since previous study. Electronically Signed   By: Lucienne Capers M.D.   On: 02/04/2022 22:23   DG Chest Port 1 View  Result Date: 02/04/2022 CLINICAL DATA:  SOB EXAM: PORTABLE  CHEST 1 VIEW COMPARISON:  Chest x-ray 10/18/2020 FINDINGS: The heart and mediastinal contours are not well visualized due to patient positioning and lung findings. Consolidation of the majority of the left lung. Airspace and interstitial opacities of the right lung most prominent at the lung bases. Likely bilateral pleural effusions. No pneumothorax. No acute osseous abnormality. Likely old healed left rib fracture. Severe degenerative changes of the left shoulder. IMPRESSION: 1. Consolidation of the majority of the left lung. Airspace and interstitial opacities of the right lung most prominent at the lung bases. 2. Likely bilateral pleural  effusions. 3. The heart and mediastinal contours are not well visualized due to patient positioning and lung findings. 4. Recommend repeat PA and lateral view of the chest with improved inspiratory effort and positioning. If CT chest obtained, please obtain with intravenous contrast. Electronically Signed   By: Iven Finn M.D.   On: 02/04/2022 20:59    Scheduled Meds:    albuterol  2.5 mg Nebulization BID   arformoterol  15 mcg Nebulization BID   atorvastatin  80 mg Oral QHS   budesonide (PULMICORT) nebulizer solution  0.5 mg Nebulization BID   furosemide  40 mg Intravenous Daily   guaiFENesin  600 mg Oral BID   insulin aspart  0-9 Units Subcutaneous Q4H   methylPREDNISolone (SOLU-MEDROL) injection  40 mg Intravenous Daily   [START ON 02/07/2022] metoprolol tartrate  12.5 mg Oral q AM   nystatin   Topical BID   sodium chloride HYPERTONIC  4 mL Nebulization BID    Continuous Infusions:    azithromycin 500 mg (02/06/22 0948)   cefTRIAXone (ROCEPHIN)  IV 2 g (02/06/22 0849)     LOS: 2 days     Vernell Leep, MD,  FACP, [place], [place], [facility], [facility]   [facility] [place]     To contact the attending provider between 7A-7P or the covering provider during after hours 7P-7A, please log into the web site [email or web address] and access using universal [place] password for that web site. If you do not have the password, please call the hospital operator.  02/06/2022, 3:12 PM

## 2022-02-07 ENCOUNTER — Encounter (HOSPITAL_COMMUNITY): Payer: Self-pay | Admitting: Internal Medicine

## 2022-02-07 ENCOUNTER — Encounter (HOSPITAL_COMMUNITY)
Admission: EM | Disposition: A | Discharge status: Home Health | Payer: Self-pay | Source: Home / Self Care | Attending: Internal Medicine

## 2022-02-07 ENCOUNTER — Inpatient Hospital Stay (HOSPITAL_COMMUNITY): Payer: Medicare Other

## 2022-02-07 ENCOUNTER — Other Ambulatory Visit: Payer: Self-pay | Admitting: Pulmonary Disease

## 2022-02-07 ENCOUNTER — Inpatient Hospital Stay (HOSPITAL_COMMUNITY): Payer: Medicare Other | Admitting: Anesthesiology

## 2022-02-07 DIAGNOSIS — I509 Heart failure, unspecified: Secondary | ICD-10-CM

## 2022-02-07 DIAGNOSIS — I2510 Atherosclerotic heart disease of native coronary artery without angina pectoris: Secondary | ICD-10-CM

## 2022-02-07 DIAGNOSIS — J398 Other specified diseases of upper respiratory tract: Secondary | ICD-10-CM

## 2022-02-07 DIAGNOSIS — I110 Hypertensive heart disease with heart failure: Secondary | ICD-10-CM

## 2022-02-07 DIAGNOSIS — T17590A Other foreign object in bronchus causing asphyxiation, initial encounter: Secondary | ICD-10-CM

## 2022-02-07 HISTORY — PX: BRONCHIAL WASHINGS: SHX5105

## 2022-02-07 HISTORY — PX: VIDEO BRONCHOSCOPY: SHX5072

## 2022-02-07 LAB — GLUCOSE, CAPILLARY
Glucose-Capillary: 204 mg/dL — ABNORMAL HIGH (ref 70–99)
Glucose-Capillary: 154 mg/dL — ABNORMAL HIGH (ref 70–99)
Glucose-Capillary: 143 mg/dL — ABNORMAL HIGH (ref 70–99)
Glucose-Capillary: 142 mg/dL — ABNORMAL HIGH (ref 70–99)
Glucose-Capillary: 138 mg/dL — ABNORMAL HIGH (ref 70–99)
Glucose-Capillary: 129 mg/dL — ABNORMAL HIGH (ref 70–99)
Glucose-Capillary: 224 mg/dL — ABNORMAL HIGH (ref 70–99)
Glucose-Capillary: 251 mg/dL — ABNORMAL HIGH (ref 70–99)

## 2022-02-07 LAB — BASIC METABOLIC PANEL
Anion gap: 10 (ref 5–15)
BUN: 19 mg/dL (ref 8–23)
CO2: 31 mmol/L (ref 22–32)
Calcium: 9.1 mg/dL (ref 8.9–10.3)
Chloride: 97 mmol/L — ABNORMAL LOW (ref 98–111)
Creatinine, Ser: 0.67 mg/dL (ref 0.61–1.24)
GFR, Estimated: >60 mL/min (ref >60)
Glucose, Bld: 165 mg/dL — ABNORMAL HIGH (ref 70–99)
Potassium: 4.3 mmol/L (ref 3.5–5.1)
Sodium: 138 mmol/L (ref 135–145)

## 2022-02-07 LAB — PROCALCITONIN: Procalcitonin: 0.11 ng/mL

## 2022-02-07 SURGERY — VIDEO BRONCHOSCOPY WITHOUT FLUORO
Anesthesia: General

## 2022-02-07 MED ORDER — ONDANSETRON HCL 4 MG/2ML IJ SOLN
INTRAMUSCULAR | Status: DC | PRN
  Administered 2022-02-07: 4 mg via INTRAVENOUS

## 2022-02-07 MED ORDER — CHLORHEXIDINE GLUCONATE CLOTH 2 % EX PADS
6.0000 | MEDICATED_PAD | Freq: Every day | CUTANEOUS | Status: DC
  Administered 2022-02-07 – 2022-02-10 (×4): 6 via TOPICAL

## 2022-02-07 MED ORDER — DEXAMETHASONE SODIUM PHOSPHATE 10 MG/ML IJ SOLN
INTRAMUSCULAR | Status: DC | PRN
  Administered 2022-02-07: 10 mg via INTRAVENOUS

## 2022-02-07 MED ORDER — LACTATED RINGERS IV SOLN
INTRAVENOUS | Status: AC | PRN
  Administered 2022-02-07: 1000 mL via INTRAVENOUS

## 2022-02-07 MED ORDER — PROPOFOL 500 MG/50ML IV EMUL
INTRAVENOUS | Status: DC | PRN
  Administered 2022-02-07: 150 ug/kg/min via INTRAVENOUS

## 2022-02-07 MED ORDER — ROCURONIUM BROMIDE 10 MG/ML (PF) SYRINGE
PREFILLED_SYRINGE | INTRAVENOUS | Status: DC | PRN
  Administered 2022-02-07: 30 mg via INTRAVENOUS

## 2022-02-07 MED ORDER — INSULIN GLARGINE-YFGN 100 UNIT/ML ~~LOC~~ SOLN
25.0000 [IU] | Freq: Every day | SUBCUTANEOUS | Status: DC
  Administered 2022-02-07 – 2022-02-10 (×4): 25 [IU] via SUBCUTANEOUS
  Filled 2022-02-07 (×4): qty 0.25

## 2022-02-07 MED ORDER — LIDOCAINE 2% (20 MG/ML) 5 ML SYRINGE
INTRAMUSCULAR | Status: DC | PRN
  Administered 2022-02-07: 80 mg via INTRAVENOUS

## 2022-02-07 MED ORDER — SUGAMMADEX SODIUM 200 MG/2ML IV SOLN
INTRAVENOUS | Status: DC | PRN
  Administered 2022-02-07 (×2): 400 mg via INTRAVENOUS

## 2022-02-07 MED ORDER — FUROSEMIDE 10 MG/ML IJ SOLN
60.0000 mg | Freq: Two times a day (BID) | INTRAMUSCULAR | Status: DC
  Administered 2022-02-07 – 2022-02-08 (×3): 60 mg via INTRAVENOUS
  Filled 2022-02-07 (×3): qty 6

## 2022-02-07 MED ORDER — SUCCINYLCHOLINE CHLORIDE 200 MG/10ML IV SOSY
PREFILLED_SYRINGE | INTRAVENOUS | Status: DC | PRN
  Administered 2022-02-07: 180 mg via INTRAVENOUS

## 2022-02-07 MED ORDER — PROPOFOL 10 MG/ML IV BOLUS
INTRAVENOUS | Status: DC | PRN
  Administered 2022-02-07: 100 mg via INTRAVENOUS

## 2022-02-07 NOTE — Op Note (Signed)
[facility] Cardiopulmonary Patient Name: Jordan Wells Date: 02/07/2022 MRN: [id number] Attending MD: Juanito Doom , MD Date of Birth: 1945/08/06 CSN: Finalized Age: 76 Admit Type: Inpatient Gender: Male Procedure:             Bronchoscopy Indications:           Lung collapse Providers:             Nathaneil Canary B. [place] Bells, MD, Velva Harman, RN, Benetta Spar, Technician Referring MD:           Medicines:             General Anesthesia Complications:         No immediate complications Estimated Blood Loss:  Estimated blood loss: none. Procedure:             Pre-Anesthesia Assessment:                        - A History and Physical has been performed. Patient                         meds and allergies have been reviewed. The risks and                         benefits of the procedure and the sedation options and                         risks were discussed with the patient. All questions                         were answered and informed consent was obtained.                         Patient identification and proposed procedure were                         verified prior to the procedure by the physician and                         the nurse in the pre-procedure area. Mental Status                         Examination: normal. Airway Examination: normal                         oropharyngeal airway. Respiratory Examination: clear                         to auscultation in the right lung and poor air                         movement in the left lung. CV Examination: normal. ASA                         Grade Assessment: III - A patient with severe systemic  disease. After reviewing the risks and benefits, the                         patient was deemed in satisfactory condition to                         undergo the procedure. The anesthesia plan was to use                         general anesthesia. Immediately  prior to                         administration of medications, the patient was                         re-assessed for adequacy to receive sedatives. The                         heart rate, respiratory rate, oxygen saturations,                         blood pressure, adequacy of pulmonary ventilation, and                         response to care were monitored throughout the                         procedure. The physical status of the patient was                         re-assessed after the procedure.                        After obtaining informed consent, the bronchoscope was                         passed under direct vision. Throughout the procedure,                         the patient's blood pressure, pulse, and oxygen                         saturations were monitored continuously. the [id number]                         ([id number]) Olympus bronchoscope was introduced through                         the mouth, via the endotracheal tube and advanced to                         the tracheobronchial tree. The procedure was                         accomplished without difficulty. The patient tolerated                         the procedure well. The total duration of the  procedure was 8 minutes. Scope In: Scope Out: Findings:      The endotracheal tube is in good position. The trachea is of normal       caliber. The carina is sharp. The tracheobronchial tree of the right       lung was examined to at least the first subsegmental level. Bronchial       mucosa and anatomy in the right lung are normal; there are no       endobronchial lesions, and no secretions.      Left Lung Abnormalities: Partially obstructing bronchomalacia was found       throughout the left tracheobronchial tree. An area of chronically       inflamed mucosa was found throughout the left tracheobronchial tree.       Mucus, plugging the airway, was found in the left upper lobe and in the        left lower lobe. The mucus was white and thick. The underlying mucosa is       inflamed.      Therapeutic suctioning was performed in the left upper lobe and in the       left lower lobe. Mucus plugs were removed from the airway and the airway       was cleared.      Bronchoalveolar lavage was performed in the lingula of the lung and sent       for aerobic culture and anaerobic culture. 30 mL of fluid were       instilled, 12 cc of cloudy fluid returned, mucus plugs present. Impression:            - Lung collapse                        - The airway examination of the right lung was normal.                        - Bronchomalacia was visualized throughout the                         tracheobronchial tree.                        - Chronic mucosal inflammation was visualized                         throughout the tracheobronchial tree.                        - A mucous plug was found in the left upper lobe and                         in the left lower lobe.                        - Therapeutic suctioning was performed.                        - Bronchoalveolar lavage was performed. Moderate Sedation:      General Anesthesia Recommendation:        - Await culture results. Procedure Code(s):     --- Professional ---  [phone], Bronchoscopy, rigid or flexible, including                         fluoroscopic guidance, when performed; with                         therapeutic aspiration of tracheobronchial tree,                         initial                        [phone], Bronchoscopy, rigid or flexible, including                         fluoroscopic guidance, when performed; with bronchial                         alveolar lavage Diagnosis Code(s):     --- Professional ---                        J98.19, Other pulmonary collapse                        J98.09, Other diseases of bronchus, not elsewhere                         classified                        J42, Unspecified  chronic bronchitis                        T17.990A, Other foreign object in respiratory tract,                         part unspecified in causing asphyxiation, initial                         encounter CPT copyright 2019 American Medical Association. All rights reserved. The codes documented in this report are preliminary and upon coder review may  be revised to meet current compliance requirements. Norlene Campbell, MD Juanito Doom, MD 02/07/2022 2:33:36 PM This report has been signed electronically. Number of Addenda: 0

## 2022-02-07 NOTE — Transfer of Care (Signed)
Immediate Anesthesia Transfer of Care Note  Patient: Jordan Wells  Procedure(s) Performed: VIDEO BRONCHOSCOPY WITHOUT FLUORO BRONCHIAL WASHINGS  Patient Location: PACU  Anesthesia Type:General  Level of Consciousness: drowsy and responds to stimulation  Airway & Oxygen Therapy: Patient Spontanous Breathing, Patient placed on Ventilator (see vital sign flow sheet for setting), and    bipap per RT  Post-op Assessment: Report given to RN and Post -op Vital signs reviewed and stable  Post vital signs: Reviewed and stable  Last Vitals:  Vitals Value Taken Time  BP 144/96   Temp    Pulse 112   Resp 16   SpO2 96     Last Pain:  Vitals:   02/07/22 1257  TempSrc: Temporal  PainSc: 0-No pain         Complications: No notable events documented.

## 2022-02-07 NOTE — Anesthesia Procedure Notes (Signed)
Procedure Name: Intubation Date/Time: 02/07/2022 2:04 PM  Performed by: Gwyndolyn Saxon, CRNAPre-anesthesia Checklist: Patient identified, Emergency Drugs available, Suction available and Patient being monitored Patient Re-evaluated:Patient Re-evaluated prior to induction Oxygen Delivery Method: Circle System Utilized Preoxygenation: Pre-oxygenation with 100% oxygen Induction Type: IV induction Ventilation: Mask ventilation without difficulty Laryngoscope Size: Miller and 2 Grade View: Grade I Tube type: Oral Tube size: 8.5 mm Number of attempts: 1 Airway Equipment and Method: Stylet and Oral airway Placement Confirmation: ETT inserted through vocal cords under direct vision, positive ETCO2 and breath sounds checked- equal and bilateral Secured at: 23 cm Tube secured with: Tape Dental Injury: Teeth and Oropharynx as per pre-operative assessment

## 2022-02-07 NOTE — Progress Notes (Signed)
Mobility Specialist Progress Note:   02/07/22 0900  Mobility  Activity Transferred from chair to bed  Level of Assistance Minimal assist, patient does 75% or more (+2)  Assistive Device Front wheel walker  Distance Ambulated (ft) 3 ft  Activity Response Tolerated fair  Mobility Referral Yes  $Mobility charge 1 Mobility   NT requesting assistance with transferring pt to bed. Required minA+2 to stand and pivot to bed. Pt left with all needs met.   Nelta Numbers Acute Rehab Secure Chat or Office Phone: [phone]

## 2022-02-07 NOTE — Care Management Important Message (Signed)
Important Message  Patient Details  Name: Jordan Wells MRN: [id number] Date of Birth: 1945/07/17   Medicare Important Message Given:  Yes     Kendall Justo Montine Circle 02/07/2022, 3:51 PM

## 2022-02-07 NOTE — Plan of Care (Signed)

## 2022-02-07 NOTE — Progress Notes (Signed)
PROGRESS NOTE   Jordan Wells  EGB:[id number]    DOB: 1945/05/17    DOA: 02/04/2022  PCP: Delorise Shiner, MD   I have briefly reviewed patients previous medical records in [facility].  Chief Complaint  Patient presents with   Respiratory Distress    Brief Narrative:  76 year old male, 76 year old male, lives with his son, ambulates with the help of walker or cane, PMH of chronic respiratory failure with hypoxia on home oxygen 4 L/min continuously, CAD s/p MI, HTN, HLD, DM, chronic diastolic CHF, COPD, tobacco use/cigars, recurrent left pleural effusion 02/2021, s/p bronchoscopy, thoracoscopy, pleural biopsy, Pleurx catheter placement at [facility], pleural fluid negative for malignant cells per PCCM, NHL s/p splenectomy and no chemotherapy or radiation, prolonged COVID-19 infection, presented to the ED on 02/04/2022 with complaints of 3 to 4 days history of progressive dyspnea, nonproductive cough, chronic orthopnea, worsening bilateral lower extremity swelling.  With his usual home oxygen, oxygen saturations were 89% and he increased home oxygen to 6 L/min.  On EMS arrival, in tripod position, noted to be in A-fib-rate controlled.  Admitted for acute on chronic respiratory failure with hypoxia, multifactorial due to whiteout of left lung on chest x-ray, left lower lobe atelectasis versus CAP versus recurrent left pleural effusion.  PCCM consulted, underwent bronchoscopy 10/24, subsequently developed dyspnea and hypoxia, transferred to ICU under their care for BiPAP and management.  TRH signed off 10/24.  Assessment & Plan:  Principal Problem:   Acute on chronic respiratory failure with hypoxemia (HCC) Active Problems:   Diabetes mellitus type 2 in obese (HCC)   CAD (coronary artery disease)   Chronic atrial fibrillation (HCC)   Morbid obesity (HCC)   Hypoxia   Other hyperlipidemia   Lung nodule   Acute on chronic respiratory failure with hypoxia: On home oxygen 4 L/min.  This  had increased to a peak of 6 L/min.  Etiology has noted below.  Incentive spirometry.  Flutter valve.  Was on 5 L/min when seen this morning.  Since then underwent bronchoscopy by PCCM, subsequently developed hypoxia and dyspnea and was transferred to ICU for BiPAP.  Left lower lobe atelectasis Vs CAP Vs R/O obstructing lung mass: Whiteout of left lung fields on chest x-ray.  PCCM input appreciated. CT chest 10/22: No significant pleural effusion.  Complete combined collapse and consolidation of the left lung with an atelectatic and opacified left mainstem bronchus.  Less pronounced dependent right lower lobe consolidation versus atelectasis. Bedside ultrasound in ED by PCCM, without significant fluid to tap but noted consolidation.  Continue IV ceftriaxone, azithromycin, hypertonic saline nebs.  Procalcitonin negative, but defer antibiotic management to PCCM.  Follow intermittent chest x-ray.  Incentive spirometry.  Eliquis was held.  PCCM did bronchoscopy on 10/24 that showed left lung collapse, large mucous plug in the left upper lobe and left lower lobe which was suctioned.  Follow BAL cultures and cytology.  Recurrent left pleural effusion: As per PCCM, history of same in 02/2021, had Pleurx catheter placed, catheter fell out, pleural fluid cytology/biopsy and culture were negative.  It was felt that he had entrapped lung.  Bedside ultrasound without fluid to tap.    COPD: No clinical bronchospasm.  Continue Pulmicort, Brovana, albuterol.  Aim for O2 saturation of 88-94%.  IV Solu-Medrol reduced to 40 mg daily-defer steroids management to PCCM.  Paroxysmal A-fib: Rate controlled.  Apixaban held for bronchoscopy.  Continue prior home dose of metoprolol 12.5 Mg daily.  As per pulmonology, plans to resume Eliquis  on 10/25.  Essential hypertension: Controlled.  Continue prior home dose of metoprolol 12.5 Mg daily.  Type II DM: A1c of 11.5 suggests poor outpatient control.  Steroids likely to make it  worse.  Was on Lantus 30 units at bedtime here, got a dose last night, developed hypoglycemia in the 40s this morning, Lantus and mealtime NovoLog discontinued.  As per DM coordinator input and my review with patient, he was taking Lantus 52 units daily and was not checking CBGs regularly.  Not sure he was compliant with diet either (ate bacon and eggs).  Blood sugars up since last afternoon, resumed Semglee at reduced dose of 25 units daily.  Hyperlipidemia: Continue atorvastatin.  Anasarca/bilateral lower extremity edema/chronic diastolic CHF: HS Troponin x1 negative.  At home on Lasix 20 mg twice daily.  Started Lasix 40 mg daily without significant diuresis.  TTE: LVEF 55-60%.  Without significant diuresis and has significant volume overload/anasarca.  Increased IV Lasix to 60 mg every 12 hours.  Discussed in detail with patient's RN regarding strict intake output, daily standing weights and leg TED hoses to be placed.  Follow BMP closely.  Not significantly hypoalbuminemic (3.5)  2.1 cm right apical lung nodule and 1.8 cm right paratracheal lymph node: Needs to be followed up closely as outpatient as per radiology recommendations i.e. in 3 months, repeat CT chest or PET/CT or tissue sampling.  As noted above, PCCM considering bronchoscopy.  Body mass index is 40.65 kg/m./Obesity     DVT prophylaxis: Place TED hose Start: 02/05/22 1049  Eliquis anticoagulation on hold   Code Status: Full Code:  Family Communication: None at bedside Disposition:  Status is: Inpatient Remains inpatient appropriate because: S/p bronchoscopy, need for BiPAP, transferred to ICU, IV meds.     Consultants:   PCCM  Procedures:     Antimicrobials:   As above   Subjective:  Seen this morning prior to procedure.  No dyspnea reported.  Leg swellings unchanged.  Stated that he was urinating well.  Objective:   Vitals:   02/07/22 1630 02/07/22 1640 02/07/22 1645 02/07/22 1726  BP: 120/83  124/78 (!)  126/93  Pulse: (!) 105 98 92 (!) 107  Resp: _0 Temp:   97.6 F (36.4 C)   TempSrc:      SpO2: 91% 94% 93% 90%  Weight:      Height:        General exam: Elderly male, moderately built and obese, somewhat unkempt, sitting up comfortably in reclining chair without distress. Respiratory system: Markedly reduced left-sided breath sounds.  Right lung fields clear to auscultation.  No increased work of breathing. Cardiovascular system: S1 & S2 heard, RRR. No JVD, murmurs, rubs, gallops or clicks.  3+ pitting bilateral leg edema up to the lower anterior abdominal wall, not much change since admission.  Telemetry personally reviewed: A-fib with controlled ventricular rate, occasional PVCs. Gastrointestinal system: Abdomen is nondistended but obese with hanging pannus, soft and nontender. No organomegaly or masses felt. Normal bowel sounds heard. Central nervous system: Alert and oriented. No focal neurological deficits. Extremities: Symmetric 5 x 5 power. Skin: Fungal infection underneath the abdominal pannus is improving.   Psychiatry: Judgement and insight appear normal. Mood & affect appropriate.     Data Reviewed:   I have personally reviewed following labs and imaging studies   CBC: Recent Labs  Lab 02/04/22 2040 02/04/22 2050 02/05/22 0239  WBC 13.8*  --  13.2*  NEUTROABS 10.7*  --   --  HGB 13.7 15.6  15.3 13.6  HCT 42.4 46.0  45.0 44.7  MCV 98.8  --  102.1*  PLT 229  --  833    Basic Metabolic Panel: Recent Labs  Lab 02/04/22 2040 02/04/22 2050 02/04/22 2358 02/05/22 0239 02/06/22 0400 02/07/22 0648  NA 139 136  136  --  138 141 138  K 5.1 4.8  4.8  --  4.6 4.0 4.3  CL 97* 95*  --  97* 96* 97*  CO2 29  --   --  30 32 31  GLUCOSE 390* 404*  --  289* 44* 165*  BUN 21 24*  --  _0 CREATININE 0.92 0.80 0.88 0.85 0.79 0.67  CALCIUM 8.9  --   --  8.9 9.0 9.1    Liver Function Tests: Recent Labs  Lab 02/04/22 2040 02/05/22 0239  AST 18 14*   ALT 17 15  ALKPHOS 74 67  BILITOT 0.5 0.7  PROT 5.9* 5.8*  ALBUMIN 3.4* 3.5    CBG: Recent Labs  Lab 02/07/22 1147 02/07/22 1306 02/07/22 1504  GLUCAP 143* 142* 138*    Microbiology Studies:   Recent Results (from the past 240 hour(s))  Resp Panel by RT-PCR (Flu A&B, Covid) Anterior Nasal Swab     Status: None   Collection Time: 02/04/22  8:38 PM   Specimen: Anterior Nasal Swab  Result Value Ref Range Status   SARS Coronavirus 2 by RT PCR NEGATIVE NEGATIVE Final    Comment: (NOTE) SARS-CoV-2 target nucleic acids are NOT DETECTED.  The SARS-CoV-2 RNA is generally detectable in upper respiratory specimens during the acute phase of infection. The lowest concentration of SARS-CoV-2 viral copies this assay can detect is 138 copies/mL. A negative result does not preclude SARS-Cov-2 infection and should not be used as the sole basis for treatment or other patient management decisions. A negative result may occur with  improper specimen collection/handling, submission of specimen other than nasopharyngeal swab, presence of viral mutation(s) within the areas targeted by this assay, and inadequate number of viral copies(<138 copies/mL). A negative result must be combined with clinical observations, patient history, and epidemiological information. The expected result is Negative.  Fact Sheet for Patients:  EntrepreneurPulse.com.au  Fact Sheet for Healthcare Providers:  IncredibleEmployment.be  This test is no t yet approved or cleared by the [place] FDA and  has been authorized for detection and/or diagnosis of SARS-CoV-2 by FDA under an Emergency Use Authorization (EUA). This EUA will remain  in effect (meaning this test can be used) for the duration of the COVID-19 declaration under Section 564(b)(1) of the Act, 21 U.S.C.section 360bbb-3(b)(1), unless the authorization is terminated  or revoked sooner.       Influenza A by PCR  NEGATIVE NEGATIVE Final   Influenza B by PCR NEGATIVE NEGATIVE Final    Comment: (NOTE) The Xpert Xpress SARS-CoV-2/FLU/RSV plus assay is intended as an aid in the diagnosis of influenza from Nasopharyngeal swab specimens and should not be used as a sole basis for treatment. Nasal washings and aspirates are unacceptable for Xpert Xpress SARS-CoV-2/FLU/RSV testing.  Fact Sheet for Patients: EntrepreneurPulse.com.au  Fact Sheet for Healthcare Providers: IncredibleEmployment.be  This test is not yet approved or cleared by the [place] FDA and has been authorized for detection and/or diagnosis of SARS-CoV-2 by FDA under an Emergency Use Authorization (EUA). This EUA will remain in effect (meaning this test can be used) for the duration of the COVID-19 declaration under Section 564(b)(1)  of the Act, 21 U.S.C. section 360bbb-3(b)(1), unless the authorization is terminated or revoked.  Performed at [facility], [place] [address], [place], [place] [address]     Radiology Studies:  DG CHEST PORT 1 VIEW  Result Date: 02/07/2022 CLINICAL DATA:  Status post park ostomy.  Respiratory distress. EXAM: PORTABLE CHEST 1 VIEW COMPARISON:  02/06/2022.  CT, 02/05/2022. FINDINGS: Improved lung aeration. Left mid and upper lung is now mostly aerated. There is persistent opacity at the left lung base. Hazy opacity also persists at the right lung base. No pneumothorax. IMPRESSION: 1. Improved left lung aeration following bronchoscopy. 2. No evidence of a procedure complication.  No pneumothorax. Electronically Signed   By: Lajean Manes M.D.   On: 02/07/2022 15:24   DG CHEST PORT 1 VIEW  Result Date: 02/06/2022 CLINICAL DATA:  Left lung atelectasis EXAM: PORTABLE CHEST 1 VIEW COMPARISON:  Chest x-ray dated February 04, 2022 FINDINGS: Cardiac and mediastinal contours are not visualized. Unchanged complete opacification of the left hemithorax. Small right pleural  effusion and right basilar atelectasis. No evidence of pneumothorax. IMPRESSION: Unchanged complete opacification of the left hemithorax, likely due to left lung collapse. Electronically Signed   By: Yetta Glassman M.D.   On: 02/06/2022 14:58    Scheduled Meds:    albuterol  2.5 mg Nebulization BID   arformoterol  15 mcg Nebulization BID   atorvastatin  80 mg Oral QHS   budesonide (PULMICORT) nebulizer solution  0.5 mg Nebulization BID   furosemide  60 mg Intravenous BID   guaiFENesin  600 mg Oral BID   insulin aspart  0-9 Units Subcutaneous Q4H   insulin glargine-yfgn  25 Units Subcutaneous Daily   methylPREDNISolone (SOLU-MEDROL) injection  40 mg Intravenous Daily   metoprolol tartrate  12.5 mg Oral q AM   nystatin   Topical BID   sodium chloride HYPERTONIC  4 mL Nebulization BID    Continuous Infusions:    azithromycin 500 mg (02/07/22 1058)   cefTRIAXone (ROCEPHIN)  IV 2 g (02/07/22 0904)     LOS: 3 days     Vernell Leep, MD,  FACP, [place], [place], [facility], [facility]   [facility] [place]     To contact the attending provider between 7A-7P or the covering provider during after hours 7P-7A, please log into the web site [email or web address] and access using universal [place] password for that web site. If you do not have the password, please call the hospital operator.  02/07/2022, 5:48 PM

## 2022-02-07 NOTE — Progress Notes (Signed)
[facility] PCCM  Bronchoscopy performed today Tracheobronchomalacia noted in left lung more with thick mucus plug completely obstructing LUL and LLL.  Removed by suctioning. Underlying airway inflamed, but LUL airway open after procedure.  Persistent moderate to severe bronchomalacia in the LLL nearly completely occluding the left lower lobe.  Do not anticipate that the LLL will open back up based on review of history from [place] last year.  Would continue pulm toilette measures for airway clearance.  F/U BAL culture and cytology  Roselie Awkward, MD [place] P[facility]M Pager: [phone] Cell: [phone] After 7:00 pm call Elink  [phone]

## 2022-02-07 NOTE — Progress Notes (Signed)
NAME:  Cindy Brindisi, MRN:  [id number], DOB:  April 21, 1945, LOS: 3 ADMISSION DATE:  02/04/2022, CONSULTATION DATE:  10/22 REFERRING MD:  Dr. Algis Liming, CHIEF COMPLAINT:  SOB   History of Present Illness:  76 y/o M who presented to [facility] ER on 10/21 with reports of SOB.  The patient reported 3 days of SOB.  He was in tripod position on EMS arrival.  At baseline he is 4L O2 dependent and required an increase to 6L to maintain saturations.  He was in atrial fibrillation on arrival, rate controlled.  Initial evaluation notable for decreased breath sounds on left.  Initial CXR showed opacification on the left side - layering effusion vs mucus plugging.  COVID/influenza screening negative.  Initial labs- Na 138, K 4.6, Cl 97, CO2 30, glucose 289, BUN 19, cr 0.85, WBC 13.2, Hgb 13.6, platelets 227.  The patient was admitted for further care.   PCCM consulted for pulmonary evaluation.   Pt reports he has difficulty lying flat due to SOB.  He uses 4L O2 at baseline. Has felt more SOB for the last 4 days. He is unaware of why he had a pleural effusion with interventions in the past.  States he has a hx of an irregular heart rhythm / murmur and is on anticoagulation.   Pertinent  Medical History  CAD s/p MI HTN HLD  DM  Chronic Diastolic CHF  4L O2 Dependent COPD Cigar Smoker Pleural Effusion - 02/2021, s/p bronch, thoracoscopy, pleural biopsy, pleurX cath.  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 Pleural fluid negative for malignant cells.  COVID -19 - 09/2020  Fournier Gangrene - 2020 Cellulitis of LE's Non-Hodgkin's Lymphoma   Significant Hospital Events: Including procedures, antibiotic start and stop dates in addition to other pertinent events   10/21 Admit with SOB, LLL atelectasis/PAN 10/22 pulm consult. Incr pulm hygiene 10/23 possible bronch pending CXR 10/24 bronch in endo, large mucus plug removed from left lung, drowsy after, requiring BIPAP  Interim  History / Subjective:   Bronchoscopy today, large left mucus plug removed with bronch Drowsy, hypoxemic after requiring BIPAP  Objective   Blood pressure 132/77, pulse 94, temperature 98 F (36.7 C), temperature source Temporal, resp. rate 13, height _0  (1.778 m), weight 128.5 kg, SpO2 91 %.        Intake/Output Summary (Last 24 hours) at 02/07/2022 1504 Last data filed at 02/07/2022 1100 Gross per 24 hour  Intake 470 ml  Output 2150 ml  Net -1680 ml   Filed Weights   02/05/22 0305 02/07/22 0457 02/07/22 1257  Weight: 120 kg 128.5 kg 128.5 kg    Examination:  General:  coughing in bed, drowsy HENT: NCAT OP clear anesthesia ventilation mask in place PULM: Improved air movement LUL but still limited LL, clear on R, weak effort CV: Irreg irreg, no mgr GI: BS+, soft, nontender MSK: normal bulk and tone Neuro: drowsy, reaching for mask    Resolved Hospital Problem list     Assessment & Plan:   Recurrent L pleural effusion LLL atelectasis due to mucus plug RUL lung nodule, right paratracheal lymphadenopathy  Tobacco use disorder COPD Chronic hypoxic respiratory failure  Acute on chronic hypercarbic and hypoxemic respiratory failure 10/24 after general anesthesia P -admit to ICU for NIMV -continue prednisone -minimize sedation -f/u BAL culture, cytology -continue brovana, pulmicort, albuterol -repeat CXR after bronchoscopy -NPO -eventually will need   AF Rate controlled  -tele -restart anticoagulation 10/25  DM II -SSI  as ordered by [place]  Likely CAP LUL -ceftriaxone/azithro -f/u BAL culture  Hypertension -monitor BP  Hyperlipidemia -resume statin tomorrow   Best Practice (right click and "Reselect all SmartList Selections" daily)  Per [facility]   Labs   CBC: Recent Labs  Lab 02/04/22 2040 02/04/22 2050 02/05/22 0239  WBC 13.8*  --  13.2*  NEUTROABS 10.7*  --   --   HGB 13.7 15.6  15.3 13.6  HCT 42.4 46.0  45.0 44.7  MCV 98.8  --  102.1*   PLT 229  --  937    Basic Metabolic Panel: Recent Labs  Lab 02/04/22 2040 02/04/22 2050 02/04/22 2358 02/05/22 0239 02/06/22 0400 02/07/22 0648  NA 139 136  136  --  138 141 138  K 5.1 4.8  4.8  --  4.6 4.0 4.3  CL 97* 95*  --  97* 96* 97*  CO2 29  --   --  30 32 31  GLUCOSE 390* 404*  --  289* 44* 165*  BUN 21 24*  --  _0 CREATININE 0.92 0.80 0.88 0.85 0.79 0.67  CALCIUM 8.9  --   --  8.9 9.0 9.1   GFR: Estimated Creatinine Clearance: 105.8 mL/min (by C-G formula based on SCr of 0.67 mg/dL). Recent Labs  Lab 02/04/22 2040 02/05/22 0239 02/06/22 0608 02/07/22 0648  PROCALCITON  --  <0.10 0.12 0.11  WBC 13.8* 13.2*  --   --     Liver Function Tests: Recent Labs  Lab 02/04/22 2040 02/05/22 0239  AST 18 14*  ALT 17 15  ALKPHOS 74 67  BILITOT 0.5 0.7  PROT 5.9* 5.8*  ALBUMIN 3.4* 3.5   No results for input(s): "LIPASE", "AMYLASE" in the last 168 hours. No results for input(s): "AMMONIA" in the last 168 hours.  ABG    Component Value Date/Time   HCO3 32.7 (H) 02/04/2022 2050   TCO2 32 02/04/2022 2050   TCO2 34 (H) 02/04/2022 2050   O2SAT 95 02/04/2022 2050     Coagulation Profile: No results for input(s): "INR", "PROTIME" in the last 168 hours.  Cardiac Enzymes: No results for input(s): "CKTOTAL", "CKMB", "CKMBINDEX", "TROPONINI" in the last 168 hours.  HbA1C: Hgb A1c MFr Bld  Date/Time Value Ref Range Status  02/05/2022 01:42 AM 11.5 (H) 4.8 - 5.6 % Final    Comment:    (NOTE) Pre diabetes:          5.7%-6.4%  Diabetes:              >6.4%  Glycemic control for   <7.0% adults with diabetes     CBG: Recent Labs  Lab 02/06/22 2315 02/07/22 0336 02/07/22 0755 02/07/22 1147 02/07/22 1306  GLUCAP 254* 204* 154* 143* 142*   My cc time 33 minutes  Roselie Awkward, MD [place] [facility] Pager: [phone] Cell: [phone] After 7:00 pm call Elink  [phone]

## 2022-02-07 NOTE — H&P (Signed)
[facility] PCCM  CC: dyspnea HPI: 76 y/o smoker on 4L O2 presented to the hospital with increased shortness of breath.  He was found to have atlectasis of the left lung with mucus plugging.   A year ago he underwent a VATS at [facility], had a pleural biopsy, pleur-X.  At that time he had a left lower lobe collapse.  He was eventually discharged from the thoracic surgery clinic.  On ultrasound he had no pleural effusion yesterday  Past Medical History:  Diagnosis Date   CHF (congestive heart failure) (HCC)    COPD (chronic obstructive pulmonary disease) ([place])    Diabetes mellitus without complication ([place])    High cholesterol    Hypertension      Family History  Problem Relation Age of Onset   Diabetes Mellitus II Neg Hx      Social History   Socioeconomic History   Marital status: Single    Spouse name: Not on file   Number of children: Not on file   Years of education: Not on file   Highest education level: Not on file  Occupational History   Not on file  Tobacco Use   Smoking status: Never   Smokeless tobacco: Never  Substance and Sexual Activity   Alcohol use: Never   Drug use: Never   Sexual activity: Not on file  Other Topics Concern   Not on file  Social History Narrative   Not on file   Social Determinants of Health   Financial Resource Strain: Not on file  Food Insecurity: Not on file  Transportation Needs: Not on file  Physical Activity: Not on file  Stress: Not on file  Social Connections: Not on file  Intimate Partner Violence: Not on file     No Known Allergies   _0 @ Vitals:   02/07/22 0457 02/07/22 0756 02/07/22 1149 02/07/22 1257  BP:  104/76 96/74 132/77  Pulse:  90 86 94  Resp:  20 13   Temp:  98.7 F (37.1 C) 98.7 F (37.1 C) 98 F (36.7 C)  TempSrc:  Oral Oral Temporal  SpO2:  90% 90% 91%  Weight: 128.5 kg   128.5 kg  Height:       4L [place]  General:  Resting comfortably in bed HENT: NCAT OP clear PULM: Diminished air  flow in left lung B, normal effort CV: RRR, no mgr GI: BS+, soft, nontender MSK: normal bulk and tone Neuro: awake, alert, no distress, MAEW  CBC    Component Value Date/Time   WBC 13.2 (H) 02/05/2022 0239   RBC 4.38 02/05/2022 0239   HGB 13.6 02/05/2022 0239   HCT 44.7 02/05/2022 0239   PLT 227 02/05/2022 0239   MCV 102.1 (H) 02/05/2022 0239   MCH 31.1 02/05/2022 0239   MCHC 30.4 02/05/2022 0239   RDW 14.6 02/05/2022 0239   LYMPHSABS 1.2 02/04/2022 2040   MONOABS 1.4 (H) 02/04/2022 2040   EOSABS 0.3 02/04/2022 2040   BASOSABS 0.1 02/04/2022 2040   Impression: Left lung atelectasis COPD Chronic respiratory failure with hypoxemia RUL mass with adenopathy Likely pneumonia LUL  Plan Bronchoscopy for therapeutic aspiration and BAL  The patient and his family understand that this is a high risk procedure which will be performed under general anesthesia.  Given his degree of dyspnea and hypoxemia they are willing to proceed.  He has failed conservative interventions (hypertonic saline, guaifenesin)  Roselie Awkward, MD [place] PCCM Pager: [phone] Cell: [phone] After 7:00 pm call Elink  (  [phone]  

## 2022-02-07 NOTE — Anesthesia Preprocedure Evaluation (Signed)
Anesthesia Evaluation  Patient identified by MRN, date of birth, ID band Patient awake    Reviewed: Allergy & Precautions, NPO status , Patient's Chart, lab work & pertinent test results, reviewed documented beta blocker date and time   History of Anesthesia Complications Negative for: history of anesthetic complications  Airway Mallampati: III  TM Distance: >3 FB Neck ROM: Full    Dental  (+) Edentulous Upper, Edentulous Lower, Dental Advisory Given   Pulmonary shortness of breath, COPD,  COPD inhaler,   No wheezing, decreased BS on left   + decreased breath sounds      Cardiovascular hypertension, Pt. on medications and Pt. on home beta blockers + CAD and +CHF   Rhythm:Regular  1. Left ventricular ejection fraction, by estimation, is 55 to 60%. The  left ventricle has normal function. The left ventricle has no regional  wall motion abnormalities. There is mild left ventricular hypertrophy.  Left ventricular diastolic parameters  are indeterminate.  2. Right ventricular systolic function was not well visualized. The right  ventricular size is not well visualized. Tricuspid regurgitation signal is  inadequate for assessing PA pressure.  3. The mitral valve was not well visualized. No evidence of mitral valve  regurgitation. No evidence of mitral stenosis.  4. The aortic valve was not well visualized. There is mild calcification  of the aortic valve. There is mild thickening of the aortic valve. Aortic  valve regurgitation is not visualized. Mild aortic valve stenosis. Aortic  valve mean gradient measures 9.5  mmHg. Aortic valve peak gradient measures 17.1 mmHg. Aortic valve area,  by VTI measures 1.55 cm.  5. Aortic dilatation noted. There is mild dilatation of the ascending  aorta, measuring 36 mm.    Neuro/Psych negative neurological ROS  negative psych ROS   GI/Hepatic negative GI ROS, Neg liver ROS,    Endo/Other  diabetes, Insulin DependentMorbid obesity  Renal/GU negative Renal ROSLab Results      Component                Value               Date                      CREATININE               0.67                02/07/2022                Musculoskeletal negative musculoskeletal ROS (+)   Abdominal   Peds  Hematology Lab Results      Component                Value               Date                      WBC                      13.2 (H)            02/05/2022                HGB                      13.6                02/05/2022  HCT                      44.7                02/05/2022                MCV                      102.1 (H)           02/05/2022                PLT                      227                 02/05/2022              Anesthesia Other Findings   Reproductive/Obstetrics                             Anesthesia Physical Anesthesia Plan  ASA: 3  Anesthesia Plan: General   Post-op Pain Management: Minimal or no pain anticipated   Induction: Intravenous  PONV Risk Score and Plan: 2 and Ondansetron, Dexamethasone, Propofol infusion and TIVA  Airway Management Planned: Oral ETT  Additional Equipment: None  Intra-op Plan:   Post-operative Plan: Extubation in OR  Informed Consent: I have reviewed the patients History and Physical, chart, labs and discussed the procedure including the risks, benefits and alternatives for the proposed anesthesia with the patient or authorized representative who has indicated his/her understanding and acceptance.     Dental advisory given  Plan Discussed with: CRNA  Anesthesia Plan Comments:         Anesthesia Quick Evaluation

## 2022-02-08 DIAGNOSIS — J9602 Acute respiratory failure with hypercapnia: Secondary | ICD-10-CM

## 2022-02-08 LAB — CBC
HCT: 41.2 % (ref 39.0–52.0)
Hemoglobin: 13.4 g/dL (ref 13.0–17.0)
MCH: 32 pg (ref 26.0–34.0)
MCHC: 32.5 g/dL (ref 30.0–36.0)
MCV: 98.3 fL (ref 80.0–100.0)
Platelets: 262 10*3/uL (ref 150–400)
RBC: 4.19 MIL/uL — ABNORMAL LOW (ref 4.22–5.81)
RDW: 14.5 % (ref 11.5–15.5)
WBC: 14.1 10*3/uL — ABNORMAL HIGH (ref 4.0–10.5)
nRBC: 0 % (ref 0.0–0.2)

## 2022-02-08 LAB — BASIC METABOLIC PANEL
Anion gap: 9 (ref 5–15)
BUN: 22 mg/dL (ref 8–23)
CO2: 36 mmol/L — ABNORMAL HIGH (ref 22–32)
Calcium: 9 mg/dL (ref 8.9–10.3)
Chloride: 94 mmol/L — ABNORMAL LOW (ref 98–111)
Creatinine, Ser: 0.91 mg/dL (ref 0.61–1.24)
GFR, Estimated: >60 mL/min (ref >60)
Glucose, Bld: 270 mg/dL — ABNORMAL HIGH (ref 70–99)
Potassium: 4.2 mmol/L (ref 3.5–5.1)
Sodium: 139 mmol/L (ref 135–145)

## 2022-02-08 LAB — GLUCOSE, CAPILLARY
Glucose-Capillary: 190 mg/dL — ABNORMAL HIGH (ref 70–99)
Glucose-Capillary: 192 mg/dL — ABNORMAL HIGH (ref 70–99)
Glucose-Capillary: 194 mg/dL — ABNORMAL HIGH (ref 70–99)
Glucose-Capillary: 230 mg/dL — ABNORMAL HIGH (ref 70–99)
Glucose-Capillary: 244 mg/dL — ABNORMAL HIGH (ref 70–99)
Glucose-Capillary: 346 mg/dL — ABNORMAL HIGH (ref 70–99)

## 2022-02-08 MED ORDER — FUROSEMIDE 40 MG PO TABS
40.0000 mg | ORAL_TABLET | Freq: Two times a day (BID) | ORAL | Status: DC
  Administered 2022-02-08 – 2022-02-09 (×2): 40 mg via ORAL
  Filled 2022-02-08 (×2): qty 1

## 2022-02-08 MED ORDER — CEFDINIR 300 MG PO CAPS
300.0000 mg | ORAL_CAPSULE | Freq: Two times a day (BID) | ORAL | Status: DC
  Administered 2022-02-08 – 2022-02-10 (×5): 300 mg via ORAL
  Filled 2022-02-08 (×6): qty 1

## 2022-02-08 MED ORDER — APIXABAN 5 MG PO TABS
5.0000 mg | ORAL_TABLET | Freq: Two times a day (BID) | ORAL | Status: DC
  Administered 2022-02-08 – 2022-02-10 (×5): 5 mg via ORAL
  Filled 2022-02-08 (×5): qty 1

## 2022-02-08 MED ORDER — ORAL CARE MOUTH RINSE: 15.0000 mL | OROMUCOSAL | Status: DC | PRN

## 2022-02-08 MED ORDER — MUPIROCIN CALCIUM 2 % EX CREA
TOPICAL_CREAM | Freq: Every day | CUTANEOUS | Status: DC
  Filled 2022-02-08: qty 15

## 2022-02-08 MED ORDER — PREDNISONE 20 MG PO TABS
40.0000 mg | ORAL_TABLET | Freq: Every day | ORAL | Status: DC
  Administered 2022-02-09 – 2022-02-10 (×2): 40 mg via ORAL
  Filled 2022-02-08 (×2): qty 2

## 2022-02-08 NOTE — Anesthesia Postprocedure Evaluation (Signed)
Anesthesia Post Note  Patient: Jordan Wells  Procedure(s) Performed: [place] WITHOUT FLUORO BRONCHIAL WASHINGS     Patient location during evaluation: PACU Anesthesia Type: General Level of consciousness: patient cooperative and awake Pain management: pain level controlled Vital Signs Assessment: post-procedure vital signs reviewed and stable Respiratory status: spontaneous breathing (respiratory status improving, BiPAP orderd with ICU dispo, Dr [place] aware and involved in care) Cardiovascular status: blood pressure returned to baseline and stable Postop Assessment: no apparent nausea or vomiting Anesthetic complications: no   No notable events documented.  Last Vitals:  Vitals:   02/08/22 0734 02/08/22 0800  BP:  132/83  Pulse:  87  Resp:  (!) 36  Temp: 37.1 C   SpO2:  95%    Last Pain:  Vitals:   02/08/22 0800  TempSrc:   PainSc: 0-No pain                 Taelyn Broecker

## 2022-02-08 NOTE — Progress Notes (Signed)
NAME:  Jordan Wells, MRN:  [id number], DOB:  Jan 21, 1946, LOS: 4 ADMISSION DATE:  02/04/2022, CONSULTATION DATE:  10/22 REFERRING MD:  Dr. Algis Liming, CHIEF COMPLAINT:  SOB   History of Present Illness:  76 y/o M who presented to [facility] ER on 10/21 with reports of SOB.   The patient reported 3 days of SOB.  He was in tripod position on EMS arrival.  At baseline he is 4L O2 dependent and required an increase to 6L to maintain saturations.  He was in atrial fibrillation on arrival, rate controlled.  Initial evaluation notable for decreased breath sounds on left.  Initial CXR showed opacification on the left side - layering effusion vs mucus plugging.  COVID/influenza screening negative.  Initial labs- Na 138, K 4.6, Cl 97, CO2 30, glucose 289, BUN 19, cr 0.85, WBC 13.2, Hgb 13.6, platelets 227.  The patient was admitted for further care.    PCCM consulted for pulmonary evaluation.    Pt reports he has difficulty lying flat due to SOB.  He uses 4L O2 at baseline. Has felt more SOB for the last 4 days. He is unaware of why he had a pleural effusion with interventions in the past.  States he has a hx of an irregular heart rhythm / murmur and is on anticoagulation.   Pertinent  Medical History  CAD s/p MI HTN HLD  DM  Chronic Diastolic CHF  4L O2 Dependent COPD Cigar Smoker Pleural Effusion - 02/2021, s/p bronch, thoracoscopy, pleural biopsy, pleurX cath.  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 Pleural fluid negative for malignant cells.  COVID -19 - 09/2020  Fournier Gangrene - 2020 Cellulitis of LE's Non-Hodgkin's Lymphoma   Significant Hospital Events: Including procedures, antibiotic start and stop dates in addition to other pertinent events   10/21 Admit with SOB, LLL atelectasis/PAN 10/22 pulm consult. Incr pulm hygiene 10/23 possible bronch pending CXR 10/24 bronch in endo, large mucus plug removed from left lung, drowsy after, requiring BIPAP 10/24 CXR  improved left lung aeration following bronchoscopy  Interim History / Subjective:  Tolerated BiPAP overnight Weaned off and placed on home 4 L [place] Awake and alert. Wants something to eat.   Objective   Blood pressure 107/73, pulse 89, temperature 98.7 F (37.1 C), temperature source Oral, resp. rate (!) 21, height _0  (1.778 m), weight 124.8 kg, SpO2 92 %.    FiO2 (%):  [50 %-70 %] 70 %   Intake/Output Summary (Last 24 hours) at 02/08/2022 0707 Last data filed at 02/07/2022 2100 Gross per 24 hour  Intake 600 ml  Output 2550 ml  Net -1950 ml   Filed Weights   02/07/22 0457 02/07/22 1257 02/08/22 0500  Weight: 128.5 kg 128.5 kg 124.8 kg    Examination: General: Well-appearing obese elderly man.  No acute distress. HENT: [place]/AT. MMM.  Lungs: On 4 L [place].  Decreased breath sounds on anterior lung fields.  No WOB Cardiovascular: Irregularly irregular rhythm.  No M/G/R. Trace BLE edema Abdomen: Soft.  NT/ND.  Normal BS. Extremities: Warm and dry. Neuro: Alert and oriented x3.  Moves all extremities.  WBC 14.1, Hgb 13.4, platelet 262 K+ 4.2, CO2 36, creatinine 0.91 CBGs 220s to [place] [facility] Problem list   Acute on chronic hypoxic respiratory failure  Assessment & Plan:  Acute on chronic hypoxic and hypercarbic respiratory failure, resolved Recurrent L pleural effusion LLL atelectasis due to mucus plug s/p bronch Repeat x-ray of the bronc  showing improved aeration. Tolerated BiPAP overnight. Now on home 4 L [place] with O2 sats above 90%.  -Continue supplemental O2 -Transfer to progressive bed  CAP LUL WBC stable. Oxygen needs improved and back to home 4 L [place]. BAL with some rare WBC but no organisms. -Discontinue azithromycin and Rocephin -Start cefdinir for 3 more days to complete 7-day course of abx -Trend CBC/fever curve  COPD exacerbation Chronic hypoxic respiratory failure  Tobacco use disorder Cough has improved. Back on home O2.  -Continue prednisone 40  mg x3 days -Continue brovana, pulmicort, albuterol  Paroxysmal A-fib Remains rate controlled. -Resume Eliquis 5 mg twice daily -Continue metoprolol 12.5 mg daily -Tele   DM II, Uncontrolled A1c 11.5. CBGs elevated with a 220s to 240s. -Continue Semglee 25 units daily -SSI, CBG monitoring -CM diet  Diastolic heart failure No evidence of heart failure exacerbation.  2.5 L out in the past 24 hours -Transition p.o. Lasix 40 mg twice daily  HTN HLD -Continue atorvastatin -Continue metoprolol  RUL lung nodule, right paratracheal lymphadenopathy  -Follow-up with pulmonology outpatient for repeat CT in 3 month  Best Practice (right click and "Reselect all SmartList Selections" daily)   Diet/type: Regular consistency (see orders) DVT prophylaxis: DOAC GI prophylaxis: N/A Lines: N/A Foley:  N/A Code Status:  full code Last date of multidisciplinary goals of care discussion [Pending]  Labs   CBC: Recent Labs  Lab 02/04/22 2040 02/04/22 2050 02/05/22 0239  WBC 13.8*  --  13.2*  NEUTROABS 10.7*  --   --   HGB 13.7 15.6  15.3 13.6  HCT 42.4 46.0  45.0 44.7  MCV 98.8  --  102.1*  PLT 229  --  119    Basic Metabolic Panel: Recent Labs  Lab 02/04/22 2040 02/04/22 2050 02/04/22 2358 02/05/22 0239 02/06/22 0400 02/07/22 0648  NA 139 136  136  --  138 141 138  K 5.1 4.8  4.8  --  4.6 4.0 4.3  CL 97* 95*  --  97* 96* 97*  CO2 29  --   --  30 32 31  GLUCOSE 390* 404*  --  289* 44* 165*  BUN 21 24*  --  _0 CREATININE 0.92 0.80 0.88 0.85 0.79 0.67  CALCIUM 8.9  --   --  8.9 9.0 9.1   GFR: Estimated Creatinine Clearance: 104.1 mL/min (by C-G formula based on SCr of 0.67 mg/dL). Recent Labs  Lab 02/04/22 2040 02/05/22 0239 02/06/22 0608 02/07/22 0648  PROCALCITON  --  <0.10 0.12 0.11  WBC 13.8* 13.2*  --   --     Liver Function Tests: Recent Labs  Lab 02/04/22 2040 02/05/22 0239  AST 18 14*  ALT 17 15  ALKPHOS 74 67  BILITOT 0.5 0.7  PROT  5.9* 5.8*  ALBUMIN 3.4* 3.5   No results for input(s): "LIPASE", "AMYLASE" in the last 168 hours. No results for input(s): "AMMONIA" in the last 168 hours.  ABG    Component Value Date/Time   HCO3 32.7 (H) 02/04/2022 2050   TCO2 32 02/04/2022 2050   TCO2 34 (H) 02/04/2022 2050   O2SAT 95 02/04/2022 2050     Coagulation Profile: No results for input(s): "INR", "PROTIME" in the last 168 hours.  Cardiac Enzymes: No results for input(s): "CKTOTAL", "CKMB", "CKMBINDEX", "TROPONINI" in the last 168 hours.  HbA1C: Hgb A1c MFr Bld  Date/Time Value Ref Range Status  02/05/2022 01:42 AM 11.5 (H) 4.8 - 5.6 % Final  Comment:    (NOTE) Pre diabetes:          5.7%-6.4%  Diabetes:              >6.4%  Glycemic control for   <7.0% adults with diabetes     CBG: Recent Labs  Lab 02/07/22 1504 02/07/22 1750 02/07/22 1927 02/07/22 2314 02/08/22 0316  GLUCAP 138* 129* 224* 251* 230*    Review of Systems:   As in HPI  Past Medical History:  He,  has a past medical history of CHF (congestive heart failure) ([place]), COPD (chronic obstructive pulmonary disease) ([place]), Diabetes mellitus without complication ([place]), High cholesterol, and Hypertension.   Surgical History:   Past Surgical History:  Procedure Laterality Date   SPLENECTOMY      Social History:   reports that he has never smoked. He has never used smokeless tobacco. He reports that he does not drink alcohol and does not use drugs.   Family History:  His family history is negative for Diabetes Mellitus II.   Allergies No Known Allergies   Home Medications  Prior to Admission medications   Medication Sig Start Date End Date Taking? Authorizing Provider  acetaminophen (TYLENOL) 325 MG tablet Take 2 tablets (650 mg total) by mouth every 6 (six) hours as needed for mild pain, fever or headache. 06/03/18  Yes Cherene Altes, MD  atorvastatin (LIPITOR) 80 MG tablet Take 80 mg by mouth at bedtime.   Yes [provider]  furosemide (LASIX) 20 MG tablet Take 20 mg by mouth 2 (two) times daily.   Yes [provider]  gabapentin (NEURONTIN) 600 MG tablet Take 600 mg by mouth at bedtime.   Yes [provider]  insulin glargine (LANTUS) 100 UNIT/ML injection Inject 52 Units into the skin at bedtime.   Yes [provider]  lisinopril (ZESTRIL) 20 MG tablet Take 20 mg by mouth daily.   Yes [provider]  metoprolol tartrate (LOPRESSOR) 25 MG tablet Take 12.5 mg by mouth in the morning.   Yes [provider]  potassium chloride SA (KLOR-CON M) 20 MEQ tablet Take 20 mEq by mouth 2 (two) times daily.   Yes [provider]  silver sulfADIAZINE (SILVADENE) 1 % cream Apply 1 Application topically daily as needed (sores).   Yes [provider]  apixaban (ELIQUIS) 5 MG TABS tablet Take 5 mg by mouth 2 (two) times daily.    [provider]     Critical care time: 16

## 2022-02-08 NOTE — Consult Note (Addendum)
[place] Nurse Consult Note: Reason for Consult: Consult requested for bilat feet.  Pt is critically ill in ICU with generalized edema and erythemia to feet and legs. Left 2nd toe with partial thickness wound, dark red and dry; 1X.8X.1cm Right anterior foot with patchy areas of partial thickness skin loss, yellow and moist, affected area is approx 2X2X.1cm, few intact clear fluid filled blisters surrounding.  Dressing procedure/placement/frequency: Topical treatment orders provided for bedside nurses to perform as follows to promote moist healing and provide antimicrobial benefits: Apply Bactroban to left 2nd toe and right anterior foot Q day and cover with foam dressings.  (Change foam dressing Q 3 days or PRN soiling.) Please re-consult if further assistance is needed.  Thank-you,  Julien Girt MSN, [place], [place], [place], [place]

## 2022-02-09 ENCOUNTER — Telehealth: Payer: Self-pay | Admitting: Physician Assistant

## 2022-02-09 ENCOUNTER — Encounter (HOSPITAL_COMMUNITY): Payer: Self-pay | Admitting: Pulmonary Disease

## 2022-02-09 DIAGNOSIS — L8990 Pressure ulcer of unspecified site, unspecified stage: Secondary | ICD-10-CM | POA: Insufficient documentation

## 2022-02-09 LAB — GLUCOSE, CAPILLARY
Glucose-Capillary: 143 mg/dL — ABNORMAL HIGH (ref 70–99)
Glucose-Capillary: 147 mg/dL — ABNORMAL HIGH (ref 70–99)
Glucose-Capillary: 322 mg/dL — ABNORMAL HIGH (ref 70–99)
Glucose-Capillary: 254 mg/dL — ABNORMAL HIGH (ref 70–99)
Glucose-Capillary: 188 mg/dL — ABNORMAL HIGH (ref 70–99)

## 2022-02-09 LAB — CYTOLOGY - NON PAP

## 2022-02-09 MED ORDER — INSULIN ASPART 100 UNIT/ML IJ SOLN: 0.0000 [IU] | Freq: Every day | INTRAMUSCULAR | Status: DC

## 2022-02-09 MED ORDER — FUROSEMIDE 20 MG PO TABS
20.0000 mg | ORAL_TABLET | Freq: Two times a day (BID) | ORAL | Status: DC
  Administered 2022-02-09 – 2022-02-10 (×2): 20 mg via ORAL
  Filled 2022-02-09 (×2): qty 1

## 2022-02-09 MED ORDER — INSULIN ASPART 100 UNIT/ML IJ SOLN
0.0000 [IU] | Freq: Three times a day (TID) | INTRAMUSCULAR | Status: DC
  Administered 2022-02-09: 11 [IU] via SUBCUTANEOUS
  Administered 2022-02-09: 8 [IU] via SUBCUTANEOUS
  Administered 2022-02-10: 3 [IU] via SUBCUTANEOUS

## 2022-02-09 MED ORDER — GABAPENTIN 600 MG PO TABS
600.0000 mg | ORAL_TABLET | Freq: Every day | ORAL | Status: DC
  Administered 2022-02-09: 600 mg via ORAL
  Filled 2022-02-09: qty 1

## 2022-02-09 MED ORDER — POTASSIUM CHLORIDE CRYS ER 20 MEQ PO TBCR
20.0000 meq | EXTENDED_RELEASE_TABLET | Freq: Two times a day (BID) | ORAL | Status: DC
  Administered 2022-02-09 – 2022-02-10 (×3): 20 meq via ORAL
  Filled 2022-02-09 (×3): qty 1

## 2022-02-09 MED ORDER — LISINOPRIL 20 MG PO TABS
20.0000 mg | ORAL_TABLET | Freq: Every day | ORAL | Status: DC
  Administered 2022-02-09 – 2022-02-10 (×2): 20 mg via ORAL
  Filled 2022-02-09 (×2): qty 1

## 2022-02-09 NOTE — Plan of Care (Signed)
  Problem: Coping: Goal: Ability to adjust to condition or change in health will improve Outcome: Progressing   Problem: Health Behavior/Discharge Planning: Goal: Ability to manage health-related needs will improve Outcome: Progressing

## 2022-02-09 NOTE — Progress Notes (Signed)
Inpatient Diabetes Program Recommendations  AACE/ADA: New Consensus Statement on Inpatient Glycemic Control (2015)  Target Ranges:  Prepandial:   less than 140 mg/dL      Peak postprandial:   less than 180 mg/dL (1-2 hours)      Critically ill patients:  140 - 180 mg/dL   Lab Results  Component Value Date   GLUCAP 322 (H) 02/09/2022   HGBA1C 11.5 (H) 02/05/2022    Review of Glycemic Control  Latest Reference Range & Units 02/08/22 20:32 02/08/22 23:51 02/09/22 05:04 02/09/22 07:55 02/09/22 12:14  Glucose-Capillary 70 - 99 mg/dL 190 (H) 192 (H) 143 (H) 147 (H) 322 (H)   Diabetes history: DM 2 Home DM Meds: Lantus 30 units BID                              Metformin 500 mg BID   Current orders for Inpatient glycemic control:  Novolog 0-15 units tid with meals and HS Prednisone 40 mg daily Semglee 25 units daily Inpatient Diabetes Program Recommendations:   May consider adding Novolog meal coverage 4 units tid with meals (hold if patient eats less than 50% or NPO).    Thanks,  Adah Perl, RN, BC-ADM Inpatient Diabetes Coordinator Pager [phone]

## 2022-02-09 NOTE — Progress Notes (Signed)
[facility] PROGRESS NOTE    Progress Note  Jordan Wells  NOI:[id number] DOB: 27-Apr-1945 DOA: 02/04/2022 PCP: Delorise Shiner, MD     Brief Narrative:   Jordan Wells is an 76 y.o. male past medical history chronic respiratory failure with hypoxia on 4 L of oxygen, COPD, essential hypertension, chronic diastolic heart failure tobacco user, recurrent pleural effusions status post bronchoscopy and thoracoscopy with pleural biopsy, currently with a Pleurx catheter placed at [facility] pleural fluid was negative for malignant cells, non-Hodgkin's lymphoma status postlumpectomy and no chemotherapy comes in with 3 to 4 days of progressive dyspnea nonproductive cough requiring 6 L of oxygen at home in the ED was tripoding noted to be in A-fib rate controlled chest x-ray showed atelectasis PCCM was consulted underwent bronchoscopy on 02/07/2022, transferred to the ICU under their care on BiPAP.  Significant events: 10/21 Admit with SOB, LLL atelectasis/PAN 10/22 pulm consult. Incr pulm hygiene 10/23 possible bronch pending CXR 10/24 bronch in endo, large mucus plug removed from left lung, drowsy after, requiring BIPAP 10/24 CXR improved left lung aeration following bronchoscopy Assessment/Plan:   Acute on chronic respiratory failure with hypoxemia (HCC)/recurrent left pleural effusion/left lower lobe:  Atelectasis with mucous plugging Due to mucous plugging status post bronchoscopy PCCM. Tolerated his BiPAP overnight they were able to wean him to 4 L oxygen 90%. Continue supplemental oxygen. Physical therapy has been consulted. He adamantly refused to go to skilled nursing facility.  Community acquired left upper lobe pneumonia: Remain afebrile, will complete a 7-day course of antibiotics.  COPD exacerbation/chronic respiratory failure with hypoxia: Back on his 4 L of oxygen which is his baseline. Continue prednisone for 3 additional days. Continue inhalers Brovana Pulmicort and  albuterol.  Paroxysmal atrial fibrillation: Eliquis has been resumed he remains rate controlled on metoprolol.  Diabetes mellitus type 2 in obese : With an A1c of 11.5, currently on long-acting insulin plus sliding scale, his blood glucose erratic likely due to steroids.  Chronic diastolic heart failure: No evidence of exacerbation resume back to his home regimen. Decrease lasix.  Right upper lung nodule: We will need to follow-up CT of the chest in 3 months with pulmonary.  Chronic kidney disease stage II: His creatinine appears to be at baseline.  Essential hypertension: Continue metoprolol.  Decrease Lasix urine is significantly concentrated.  RN Pressure Injury Documentation: Pressure Injury 06/01/18 Stage II -  Partial thickness loss of dermis presenting as a shallow open ulcer with a red, pink wound bed without slough. pink, nonblachable, skin broken (Active)  06/01/18 0325  Location: Heel  Location Orientation: Right  Staging: Stage II -  Partial thickness loss of dermis presenting as a shallow open ulcer with a red, pink wound bed without slough.  Wound Description (Comments): pink, nonblachable, skin broken  Present on Admission: Yes     Pressure Injury 02/07/22 Buttocks Right;Lower Stage 2 -  Partial thickness loss of dermis presenting as a shallow open injury with a red, pink wound bed without slough. round pink open area (Active)  02/07/22 1803  Location: Buttocks  Location Orientation: Right;Lower  Staging: Stage 2 -  Partial thickness loss of dermis presenting as a shallow open injury with a red, pink wound bed without slough.  Wound Description (Comments): round pink open area  Present on Admission: Yes  Dressing Type Foam - Lift dressing to assess site every shift 02/09/22 0303    Estimated body mass index is 39.73 kg/m as calculated from the following:   Height as  of this encounter: _0  (1.778 m).   Weight as of this encounter: 125.6 kg.   DVT  prophylaxis: lovenox Family Communication:none Status is: Inpatient Remains inpatient appropriate because: Acute respiratory failure with hypoxia due to pneumonia awaiting PT and OT evaluation.    Code Status:     Code Status Orders  (From admission, onward)           Start     Ordered   02/05/22 0003  Full code  Continuous        02/05/22 0002           Code Status History     Date Active Date Inactive Code Status Order ID Comments User Context   06/01/2018 0304 06/03/2018 2040 Full Code [id number]  Rise Patience, MD ED         IV Access:   Peripheral IV   Procedures and diagnostic studies:   DG CHEST PORT 1 VIEW  Result Date: 02/07/2022 CLINICAL DATA:  Status post park ostomy.  Respiratory distress. EXAM: PORTABLE CHEST 1 VIEW COMPARISON:  02/06/2022.  CT, 02/05/2022. FINDINGS: Improved lung aeration. Left mid and upper lung is now mostly aerated. There is persistent opacity at the left lung base. Hazy opacity also persists at the right lung base. No pneumothorax. IMPRESSION: 1. Improved left lung aeration following bronchoscopy. 2. No evidence of a procedure complication.  No pneumothorax. Electronically Signed   By: Lajean Manes M.D.   On: 02/07/2022 15:24     Medical Consultants:   None.   Subjective:    Jordan Wells he relates he is going home does not matter what I say.  Objective:    Vitals:   02/08/22 2311 02/09/22 0303 02/09/22 0500 02/09/22 0756  BP: (!) 116/91 92/70  112/69  Pulse: 91 85  86  Resp: _1 Temp: 98.3 F (36.8 C) 98.2 F (36.8 C)  98 F (36.7 C)  TempSrc: Oral Oral  Oral  SpO2: 92%   95%  Weight:   125.6 kg   Height:       SpO2: 95 % O2 Flow Rate (L/min): 5 L/min FiO2 (%): 70 %   Intake/Output Summary (Last 24 hours) at 02/09/2022 0822 Last data filed at 02/09/2022 0756 Gross per 24 hour  Intake 120 ml  Output 1500 ml  Net -1380 ml   Filed Weights   02/07/22 1257 02/08/22 0500 02/09/22 0500   Weight: 128.5 kg 124.8 kg 125.6 kg    Exam: General exam: In no acute distress. Respiratory system: Good air movement and clear to auscultation. Cardiovascular system: S1 & S2 heard, RRR. No JVD. Gastrointestinal system: Abdomen is nondistended, soft and nontender.  Extremities: No pedal edema. Skin: No rashes, lesions or ulcers Psychiatry: Judgement and insight appear normal. Mood & affect appropriate.    Data Reviewed:    Labs: Basic Metabolic Panel: Recent Labs  Lab 02/04/22 2040 02/04/22 2050 02/04/22 2358 02/05/22 0239 02/06/22 0400 02/07/22 0648 02/08/22 0716  NA 139 136  136  --  138 141 138 139  K 5.1 4.8  4.8  --  4.6 4.0 4.3 4.2  CL 97* 95*  --  97* 96* 97* 94*  CO2 29  --   --  30 32 31 36*  GLUCOSE 390* 404*  --  289* 44* 165* 270*  BUN 21 24*  --  _2 CREATININE 0.92 0.80 0.88 0.85 0.79 0.67 0.91  CALCIUM 8.9  --   --  8.9 9.0 9.1 9.0   GFR Estimated Creatinine Clearance: 91.8 mL/min (by C-G formula based on SCr of 0.91 mg/dL). Liver Function Tests: Recent Labs  Lab 02/04/22 2040 02/05/22 0239  AST 18 14*  ALT 17 15  ALKPHOS 74 67  BILITOT 0.5 0.7  PROT 5.9* 5.8*  ALBUMIN 3.4* 3.5   No results for input(s): "LIPASE", "AMYLASE" in the last 168 hours. No results for input(s): "AMMONIA" in the last 168 hours. Coagulation profile No results for input(s): "INR", "PROTIME" in the last 168 hours. COVID-19 Labs  No results for input(s): "DDIMER", "FERRITIN", "LDH", "CRP" in the last 72 hours.  Lab Results  Component Value Date   [place] NEGATIVE 02/04/2022    CBC: Recent Labs  Lab 02/04/22 2040 02/04/22 2050 02/05/22 0239 02/08/22 0716  WBC 13.8*  --  13.2* 14.1*  NEUTROABS 10.7*  --   --   --   HGB 13.7 15.6  15.3 13.6 13.4  HCT 42.4 46.0  45.0 44.7 41.2  MCV 98.8  --  102.1* 98.3  PLT 229  --  227 262   Cardiac Enzymes: No results for input(s): "CKTOTAL", "CKMB", "CKMBINDEX", "TROPONINI" in the last 168  hours. BNP (last 3 results) No results for input(s): "PROBNP" in the last 8760 hours. CBG: Recent Labs  Lab 02/08/22 1532 02/08/22 2032 02/08/22 2351 02/09/22 0504 02/09/22 0755  GLUCAP 194* 190* 192* 143* 147*   D-Dimer: No results for input(s): "DDIMER" in the last 72 hours. Hgb A1c: No results for input(s): "HGBA1C" in the last 72 hours. Lipid Profile: No results for input(s): "CHOL", "HDL", "LDLCALC", "TRIG", "CHOLHDL", "LDLDIRECT" in the last 72 hours. Thyroid function studies: No results for input(s): "TSH", "T4TOTAL", "T3FREE", "THYROIDAB" in the last 72 hours.  Invalid input(s): "FREET3" Anemia work up: No results for input(s): "VITAMINB12", "FOLATE", "FERRITIN", "TIBC", "IRON", "RETICCTPCT" in the last 72 hours. Sepsis Labs: Recent Labs  Lab 02/04/22 2040 02/05/22 0239 02/06/22 0608 02/07/22 0648 02/08/22 0716  PROCALCITON  --  <0.10 0.12 0.11  --   WBC 13.8* 13.2*  --   --  14.1*   Microbiology Recent Results (from the past 240 hour(s))  Resp Panel by RT-PCR (Flu A&B, Covid) Anterior Nasal Swab     Status: None   Collection Time: 02/04/22  8:38 PM   Specimen: Anterior Nasal Swab  Result Value Ref Range Status   SARS Coronavirus 2 by RT PCR NEGATIVE NEGATIVE Final    Comment: (NOTE) SARS-CoV-2 target nucleic acids are NOT DETECTED.  The SARS-CoV-2 RNA is generally detectable in upper respiratory specimens during the acute phase of infection. The lowest concentration of SARS-CoV-2 viral copies this assay can detect is 138 copies/mL. A negative result does not preclude SARS-Cov-2 infection and should not be used as the sole basis for treatment or other patient management decisions. A negative result may occur with  improper specimen collection/handling, submission of specimen other than nasopharyngeal swab, presence of viral mutation(s) within the areas targeted by this assay, and inadequate number of viral copies(<138 copies/mL). A negative result must  be combined with clinical observations, patient history, and epidemiological information. The expected result is Negative.  Fact Sheet for Patients:  EntrepreneurPulse.com.au  Fact Sheet for Healthcare Providers:  IncredibleEmployment.be  This test is no t yet approved or cleared by the [place] FDA and  has been authorized for detection and/or diagnosis of SARS-CoV-2 by FDA under an Emergency Use Authorization (EUA). This EUA will remain  in effect (meaning this test can  be used) for the duration of the COVID-19 declaration under Section 564(b)(1) of the Act, 21 U.S.C.section 360bbb-3(b)(1), unless the authorization is terminated  or revoked sooner.       Influenza A by PCR NEGATIVE NEGATIVE Final   Influenza B by PCR NEGATIVE NEGATIVE Final    Comment: (NOTE) The Xpert Xpress SARS-CoV-2/FLU/RSV plus assay is intended as an aid in the diagnosis of influenza from Nasopharyngeal swab specimens and should not be used as a sole basis for treatment. Nasal washings and aspirates are unacceptable for Xpert Xpress SARS-CoV-2/FLU/RSV testing.  Fact Sheet for Patients: EntrepreneurPulse.com.au  Fact Sheet for Healthcare Providers: IncredibleEmployment.be  This test is not yet approved or cleared by the [place] FDA and has been authorized for detection and/or diagnosis of SARS-CoV-2 by FDA under an Emergency Use Authorization (EUA). This EUA will remain in effect (meaning this test can be used) for the duration of the COVID-19 declaration under Section 564(b)(1) of the Act, 21 U.S.C. section 360bbb-3(b)(1), unless the authorization is terminated or revoked.  Performed at [facility], [address], [place], [place] [address]   Culture, Respiratory w Gram Stain     Status: None (Preliminary result)   Collection Time: 02/07/22  2:29 PM   Specimen: Bronchoalveolar Lavage; Respiratory  Result  Value Ref Range Status   Specimen Description BRONCHIAL ALVEOLAR LAVAGE  Final   Special Requests NONE  Final   Gram Stain   Final    RARE WBC PRESENT,BOTH PMN AND MONONUCLEAR NO ORGANISMS SEEN    Culture   Final    CULTURE REINCUBATED FOR BETTER GROWTH Performed at [facility], [address], [place], [place] [address]    Report Status PENDING  Incomplete     Medications:    albuterol  2.5 mg Nebulization BID   apixaban  5 mg Oral BID   arformoterol  15 mcg Nebulization BID   atorvastatin  80 mg Oral QHS   budesonide (PULMICORT) nebulizer solution  0.5 mg Nebulization BID   cefdinir  300 mg Oral Q12H   Chlorhexidine Gluconate Cloth  6 each Topical Daily   furosemide  40 mg Oral BID   guaiFENesin  600 mg Oral BID   insulin aspart  0-9 Units Subcutaneous Q4H   insulin glargine-yfgn  25 Units Subcutaneous Daily   metoprolol tartrate  12.5 mg Oral q AM   mupirocin cream   Topical Daily   nystatin   Topical BID   predniSONE  40 mg Oral Q breakfast   Continuous Infusions:    LOS: 5 days   Charlynne Cousins  [facility]  02/09/2022, 8:22 AM

## 2022-02-09 NOTE — TOC Initial Note (Addendum)
Transition of Care [facility]) - Initial/Assessment Note    Patient Details  Name: Jordan Wells MRN: [id number] Date of Birth: [birth year]  [facility]) CM/SW Contact:    Cyndi Bender, RN Phone Number: 02/09/2022, 2:49 PM  Clinical Narrative:                 Spoke to son, Jordan Wells, regarding transition needs.  Patient lives with son and is active with [facility]. Patient has home 02 with [employer].   Home address is [address], [place], [place] [address].  PCP confirmed.   TOC will continue to follow for needs.  Will need Home health orders  Expected Discharge Plan: [place] Barriers to Discharge: Continued Medical Work up   Patient Goals and CMS Choice Patient states their goals for this hospitalization and ongoing recovery are:: return home CMS Medicare.gov Compare Post Acute Care list provided to:: Patient Represenative (must comment) Choice offered to / list presented to : Adult Children  Expected Discharge Plan and Services Expected Discharge Plan: [place]   Discharge Planning Services: CM Consult Post Acute Care Choice: Home Health, Durable Medical Equipment Living arrangements for the past 2 months: [place]: PT HH Agency: [place] Date [place]: 02/09/22 Time [place]: [id number] Representative spoke with at [place]: [place] Arrangements/Services Living arrangements for the past 2 months: [place] Agency with:: Adult Children Patient language and need for interpreter reviewed:: Yes        Need for Family Participation in Patient Care: Yes (Comment) Care giver support system in place?: Yes (comment)   Criminal Activity/Legal Involvement Pertinent to Current Situation/Hospitalization: No - Comment as needed  Activities of Daily Living      Permission Sought/Granted                  Emotional  Assessment       Orientation: : Oriented to Place, Oriented to Self Alcohol / Substance Use: Not Applicable Psych Involvement: No (comment)  Admission diagnosis:  Acute on chronic respiratory failure with hypoxemia ([place]) [J96.21] Patient Active Problem List   Diagnosis Date Noted   Pressure injury of skin 02/09/2022   Lung nodule    Chronic atrial fibrillation ([place]) 02/04/2022   Morbid obesity ([place]) 02/04/2022   Acute on chronic respiratory failure with hypoxemia ([place]) 02/04/2022   Hypoxia 10/14/2020   Diabetes mellitus type 2 in obese ([place]) 06/01/2018   CAD (coronary artery disease) 06/01/2018   Other hyperlipidemia 05/08/2018   PCP:  Delorise Shiner, MD Pharmacy:   [id number] [place]Phone: [phone]: [phone] [facility] [address]Phone: [phone]: [phone]    Social Determinants of Health (SDOH) Interventions    Readmission Risk Interventions     No data to display

## 2022-02-09 NOTE — Evaluation (Signed)
Physical Therapy Evaluation Patient Details Name: Jordan Wells MRN: [id number] DOB: [birth year] Today's Date: 02/09/2022  History of Present Illness  The pt is a 76 yo male presenting 10/21 with respiratory distress. Pt chronically uses 4L O2, had increased to 6Lwith sx persisting upon arrival of EMS. Pt found to have mucous plugging and acute on chronic respiratory failure. PMH includes: CHF, COPD, CAD s/p MI, HLD, DM II, HTN, and morbid obesity.   Clinical Impression  Pt in bed upon arrival of PT, agreeable to evaluation at this time. Prior to admission the pt was mobilizing short distances within the home with use of rollator, but reports that distances of ~20-25 ft within the home (to bathroom or kitchen) often make him short of breath and require increase in home O2 from 4L to 6L. The pt lives with a son who typically assists with ADLs and IADLs, provides all transportation, and is home to assist as needed. The pt now presents with limitations in functional mobility, strength, power, endurance, and dynamic stability due to above dx and chronic debility, and will continue to benefit from skilled PT to address these deficits. The pt typically uses a lift chair and therefore states he is unable to stand from recliner without significant assist of 2. However, the pt completed well with use of momentum and modA of 2, suspect he could perform with less assist if the pt put in greater effort for the transfer. He was limited to ~59f ambulation with use of RW at this time due to reports of fatigue and SOB with activity. SpO2 to low of 86% on 4L with short bout of ambulation, and needed both seated rest and O2 increase to 6L to recover to 88-90%. Given deficits in mobility and endurance, will benefit from continued skilled PT acutely and after d/c, but pt has good support and all needed DME to allow for safest possible d/c home with his son.         Recommendations for follow up therapy are one component of  a multi-disciplinary discharge planning process, led by the attending physician.  Recommendations may be updated based on patient status, additional functional criteria and insurance authorization.  Follow Up Recommendations Home health PT      Assistance Recommended at Discharge Frequent or constant Supervision/Assistance  Patient can return home with the following  A little help with walking and/or transfers;A little help with bathing/dressing/bathroom;Assistance with cooking/housework;Direct supervision/assist for medications management;Direct supervision/assist for financial management;Assist for transportation;Help with stairs or ramp for entrance    Equipment Recommendations None recommended by PT (pt has needed DME)  Recommendations for Other Services       Functional Status Assessment Patient has had a recent decline in their functional status and demonstrates the ability to make significant improvements in function in a reasonable and predictable amount of time.     Precautions / Restrictions Precautions Precautions: Fall Precaution Comments: watch SpO2, goal of 88-94%. on 4L O2 at baseline Restrictions Weight Bearing Restrictions: No      Mobility  Bed Mobility               General bed mobility comments: pt OOB in recliner at start and end of session, does not sleep in bed    Transfers Overall transfer level: Needs assistance Equipment used: Rolling walker (2 wheels) Transfers: Sit to/from Stand Sit to Stand: Mod assist, +2 physical assistance           General transfer comment: modA of 2 due to  limited pt effort. Pt states he has a lift chair at home and therefore he is "unable" to complete sit-stand, questionable effort from pt, but he was able to stand with only modA of 2 and use of momentum, suspect he could do more if motivated but would likely still need some assist    Ambulation/Gait Ambulation/Gait assistance: Min assist Gait Distance (Feet): 6  Feet Assistive device: Rolling walker (2 wheels) Gait Pattern/deviations: Step-to pattern, Decreased stride length, Trunk flexed Gait velocity: decreased Gait velocity interpretation: <1.31 ft/sec, indicative of household ambulator   General Gait Details: pt with significant trunk and hip flexion, reports this is baseline. limited to ~6 ft forwards and back with use of RW due to limited endurance. SpO2 to 86% and needing 6L O2 to recover to 88-90s.     Balance Overall balance assessment: History of Falls, Needs assistance Sitting-balance support: No upper extremity supported, Feet supported Sitting balance-Leahy Scale: Good Sitting balance - Comments: static sitting without assist   Standing balance support: Bilateral upper extremity supported, During functional activity Standing balance-Leahy Scale: Poor Standing balance comment: dependent on BUE support and limited to <1 min standing tolerance                             Pertinent Vitals/Pain Pain Assessment Pain Assessment: No/denies pain    Home Living Family/patient expects to be discharged to:: Private residence Living Arrangements: Children Available Help at Discharge: Family;Available 24 hours/day Type of Home: House Home Access: Ramped entrance       Home Layout: Two level;Full bath on main level;Bed/bath upstairs Home Equipment: Grab bars - toilet;Grab bars - tub/shower;Rollator (4 wheels);Cane - single point;Shower seat;Wheelchair - manual;Toilet riser;BSC/3in1 (oxygen) Additional Comments: sleeps in lift chair    Prior Function Prior Level of Function : Needs assist       Physical Assist : ADLs (physical)   ADLs (physical): Bathing;Dressing;IADLs Mobility Comments: pt uses rollator in the home, cane for MD appointments, 2-3 falls in last 6 months. primarily uses lift chair ADLs Comments: relies on son, sits for shower     Hand Dominance   Dominant Hand: Right    Extremity/Trunk Assessment    Upper Extremity Assessment Upper Extremity Assessment: Defer to OT evaluation (chronic L shoulder limitations)    Lower Extremity Assessment Lower Extremity Assessment: Generalized weakness (bilateral edema, unable to power up to standing without significant assist and dependence on BUE support.)    Cervical / Trunk Assessment Cervical / Trunk Assessment: Kyphotic  Communication   Communication: No difficulties  Cognition Arousal/Alertness: Awake/alert Behavior During Therapy: WFL for tasks assessed/performed Overall Cognitive Status: Within Functional Limits for tasks assessed                                 General Comments: pt likely at baseline cognition, able to answer questions appropriately in session, pt perseverating on differences in equipment at home that makes transfers easier        General Comments General comments (skin integrity, edema, etc.): SpO2 stable on 4L at rest, desat to 86% on 4L with ~6 ft ambulation and needing seated rest with 6L O2 to recover to 88-90s        Assessment/Plan    PT Assessment Patient needs continued PT services  PT Problem List Cardiopulmonary status limiting activity;Decreased strength;Decreased range of motion;Decreased activity tolerance;Decreased balance;Decreased mobility;Decreased safety awareness;Obesity  PT Treatment Interventions DME instruction;Gait training;Stair training;Functional mobility training;Therapeutic exercise;Therapeutic activities;Balance training;Patient/family education    PT Goals (Current goals can be found in the Care Plan section)  Acute Rehab PT Goals Patient Stated Goal: return home, be able to walk to his bathroom (~25 ft) PT Goal Formulation: With patient/family Time For Goal Achievement: 02/23/22 Potential to Achieve Goals: Good    Frequency Min 3X/week        AM-PAC PT "6 Clicks" Mobility  Outcome Measure Help needed turning from your back to your side while in a  flat bed without using bedrails?: A Lot Help needed moving from lying on your back to sitting on the side of a flat bed without using bedrails?: A Lot Help needed moving to and from a bed to a chair (including a wheelchair)?: A Little Help needed standing up from a chair using your arms (e.g., wheelchair or bedside chair)?: A Lot Help needed to walk in hospital room?: A Lot Help needed climbing 3-5 steps with a railing? : A Lot 6 Click Score: 13    End of Session Equipment Utilized During Treatment: Gait belt;Oxygen Activity Tolerance: Patient limited by fatigue Patient left: in chair;with call bell/phone within reach;with family/visitor present Nurse Communication: Mobility status PT Visit Diagnosis: Other abnormalities of gait and mobility (R26.89);Muscle weakness (generalized) (M62.81);History of falling (Z91.81)    Time: 8937-3428 PT Time Calculation (min) (ACUTE ONLY): 26 min   Charges:   PT Evaluation $PT Eval Low Complexity: 1 Low PT Treatments $Therapeutic Exercise: 8-22 mins        [place], PT, DPT   Acute Rehabilitation Department  Sandra Cockayne 02/09/2022, 11:21 AM

## 2022-02-09 NOTE — Progress Notes (Signed)
PCCM Brief Note  Transferred to [facility] service today and PCCM off. Telephone note sent to our office today 10/26 to please arrange for outpatient follow up with repeat CT in 3 months.   Montey Hora, [facility] For pager details, please see AMION or use Epic chat  After 1900, please call [facility] for cross coverage needs 02/09/2022, 9:15 AM

## 2022-02-09 NOTE — Progress Notes (Signed)
Pt refusing to go on bipap tonight. RT explained to pt if he were to get in any sort of distress, we would have to place him on bipap.

## 2022-02-09 NOTE — Evaluation (Signed)
Occupational Therapy Evaluation Patient Details Name: Jordan Wells MRN: [id number] DOB: [birth year] Today's Date: 02/09/2022   History of Present Illness The pt is a 76 yo male presenting 10/21 with respiratory distress. Pt chronically uses 4L O2, had increased to 6Lwith sx persisting upon arrival of EMS. Pt found to have mucous plugging and acute on chronic respiratory failure. PMH includes: CHF, COPD, CAD s/p MI, HLD, DM II, HTN, and morbid obesity.   Clinical Impression   Patient admitted for the diagnosis above.  PTA he lives with his son, who assist with showers, home management, community mobility, meal prep and toileting.  Patient is declining any changes in his functional status, compared to home.  Patient is not interested in OT in the acute setting.  He was assisted to recliner for lunch, needing Mod A for stand pivot.  OT will discontinue per patient request.  He is hoping to return home soon, and states he has all needed DME.      Recommendations for follow up therapy are one component of a multi-disciplinary discharge planning process, led by the attending physician.  Recommendations may be updated based on patient status, additional functional criteria and insurance authorization.   Follow Up Recommendations  No OT follow up    Assistance Recommended at Discharge Frequent or constant Supervision/Assistance  Patient can return home with the following A lot of help with walking and/or transfers;A lot of help with bathing/dressing/bathroom;Assist for transportation;Help with stairs or ramp for entrance;Assistance with cooking/housework    Functional Status Assessment  Patient has not had a recent decline in their functional status  Equipment Recommendations  None recommended by OT    Recommendations for Other Services       Precautions / Restrictions Precautions Precautions: Fall Precaution Comments: watch SpO2, goal of 88-94%. on 4L O2 at baseline Restrictions Weight  Bearing Restrictions: No      Mobility Bed Mobility                 Patient Response: Cooperative  Transfers Overall transfer level: Needs assistance   Transfers: Sit to/from Stand, Bed to chair/wheelchair/BSC Sit to Stand: Mod assist Stand pivot transfers: Min assist, Mod assist                Balance Overall balance assessment: History of Falls, Needs assistance Sitting-balance support: No upper extremity supported, Feet supported Sitting balance-Leahy Scale: Good Sitting balance - Comments: static sitting without assist   Standing balance support: Bilateral upper extremity supported, During functional activity Standing balance-Leahy Scale: Poor Standing balance comment: dependent on BUE support and limited to <1 min standing tolerance                           ADL either performed or assessed with clinical judgement   ADL Overall ADL's : At baseline                                             Vision Baseline Vision/History: 1 Wears glasses Patient Visual Report: No change from baseline       Perception Perception Perception: Within Functional Limits   Praxis Praxis Praxis: Intact    Pertinent Vitals/Pain Pain Assessment Pain Assessment: No/denies pain     Hand Dominance Right   Extremity/Trunk Assessment Upper Extremity Assessment Upper Extremity Assessment: LUE deficits/detail LUE Deficits / Details: prior  humeral fx with chronic shoulder dysfunction.  Can touch his nose, and forehead LUE Sensation: WNL   Lower Extremity Assessment Lower Extremity Assessment: Defer to PT evaluation   Cervical / Trunk Assessment Cervical / Trunk Assessment: Kyphotic   Communication Communication Communication: No difficulties   Cognition Arousal/Alertness: Awake/alert Behavior During Therapy: WFL for tasks assessed/performed Overall Cognitive Status: Within Functional Limits for tasks assessed                                  General Comments: has his routine, set in his ways     General Comments       Exercises     Shoulder Instructions      Home Living Family/patient expects to be discharged to:: Private residence Living Arrangements: Children Available Help at Discharge: Family;Available 24 hours/day Type of Home: House Home Access: Ramped entrance     Home Layout: Two level;Full bath on main level;Bed/bath upstairs;Able to live on main level with bedroom/bathroom Alternate Level Stairs-Number of Steps: sleeps in his power lift recliner   Bathroom Shower/Tub: Walk-in shower     Bathroom Accessibility: Yes   Home Equipment: Grab bars - toilet;Grab bars - tub/shower;Rollator (4 wheels);Cane - single point;Shower seat;Wheelchair - manual;Toilet riser;BSC/3in1;Hand held shower head          Prior Functioning/Environment Prior Level of Function : Needs assist       Physical Assist : ADLs (physical)   ADLs (physical): Bathing;Dressing;IADLs Mobility Comments: pt uses rollator in the home, cane for MD appointments, 2-3 falls in last 6 months. primarily uses lift chair ADLs Comments: relies on son for ADL, iADL and community mobility, sits for shower        OT Problem List: Decreased range of motion;Decreased activity tolerance;Impaired balance (sitting and/or standing);Decreased safety awareness      OT Treatment/Interventions:      OT Goals(Current goals can be found in the care plan section) Acute Rehab OT Goals Patient Stated Goal: Home today or tomorrow OT Goal Formulation: With patient Time For Goal Achievement: 02/20/22 Potential to Achieve Goals: Good  OT Frequency:      Co-evaluation              AM-PAC OT "6 Clicks" Daily Activity     Outcome Measure Help from another person eating meals?: None Help from another person taking care of personal grooming?: A Little Help from another person toileting, which includes using toliet, bedpan, or urinal?:  A Lot Help from another person bathing (including washing, rinsing, drying)?: A Lot Help from another person to put on and taking off regular upper body clothing?: A Little Help from another person to put on and taking off regular lower body clothing?: A Lot 6 Click Score: 16   End of Session Equipment Utilized During Treatment: Oxygen  Activity Tolerance: Patient tolerated treatment well Patient left: in chair;with call bell/phone within reach  OT Visit Diagnosis: Unsteadiness on feet (R26.81);Muscle weakness (generalized) (M62.81);History of falling (Z91.81)                Time: 2202-5427 OT Time Calculation (min): 16 min Charges:  OT General Charges $OT Visit: 1 Visit OT Evaluation $OT Eval Moderate Complexity: 1 Mod  02/09/2022  RP, OTR/L  Acute Rehabilitation Services  Office:  [phone]   Metta Clines 02/09/2022, 2:19 PM

## 2022-02-10 ENCOUNTER — Other Ambulatory Visit (HOSPITAL_COMMUNITY): Payer: Self-pay

## 2022-02-10 DIAGNOSIS — E669 Obesity, unspecified: Secondary | ICD-10-CM

## 2022-02-10 DIAGNOSIS — I482 Chronic atrial fibrillation, unspecified: Secondary | ICD-10-CM

## 2022-02-10 DIAGNOSIS — E1169 Type 2 diabetes mellitus with other specified complication: Secondary | ICD-10-CM

## 2022-02-10 LAB — CULTURE, RESPIRATORY W GRAM STAIN

## 2022-02-10 LAB — GLUCOSE, CAPILLARY: Glucose-Capillary: 159 mg/dL — ABNORMAL HIGH (ref 70–99)

## 2022-02-10 MED ORDER — DOXYCYCLINE HYCLATE 100 MG PO TABS: 100.0000 mg | ORAL_TABLET | Freq: Two times a day (BID) | ORAL | 0 refills | Status: DC

## 2022-02-10 MED ORDER — INSULIN ASPART 100 UNIT/ML FLEXPEN
5.0000 [IU] | PEN_INJECTOR | Freq: Three times a day (TID) | SUBCUTANEOUS | 11 refills | Status: DC
  Filled 2022-02-10: qty 3, 20d supply, fill #0

## 2022-02-10 MED ORDER — PREDNISONE 10 MG PO TABS
ORAL_TABLET | ORAL | 0 refills | Status: DC
  Filled 2022-02-10: qty 42, fill #0

## 2022-02-10 MED ORDER — INSULIN GLARGINE 100 UNIT/ML ~~LOC~~ SOLN
40.0000 [IU] | Freq: Every day | SUBCUTANEOUS | 11 refills | Status: DC
  Filled 2022-02-10: qty 10, 25d supply, fill #0

## 2022-02-10 MED ORDER — PREDNISONE 10 MG PO TABS: ORAL_TABLET | ORAL | 0 refills | Status: DC

## 2022-02-10 MED ORDER — CEFDINIR 300 MG PO CAPS: 300.0000 mg | ORAL_CAPSULE | Freq: Two times a day (BID) | ORAL | 0 refills | Status: DC

## 2022-02-10 MED ORDER — INSULIN GLARGINE 100 UNIT/ML SOLOSTAR PEN
50.0000 [IU] | PEN_INJECTOR | Freq: Every day | SUBCUTANEOUS | 12 refills | Status: DC
  Filled 2022-02-10: qty 15, 30d supply, fill #0

## 2022-02-10 MED ORDER — DOXYCYCLINE HYCLATE 100 MG PO TABS
100.0000 mg | ORAL_TABLET | Freq: Two times a day (BID) | ORAL | 0 refills | Status: DC
  Filled 2022-02-10: qty 5, 3d supply, fill #0

## 2022-02-10 MED ORDER — DOXYCYCLINE HYCLATE 100 MG PO TABS
100.0000 mg | ORAL_TABLET | Freq: Two times a day (BID) | ORAL | Status: DC
  Administered 2022-02-10: 100 mg via ORAL
  Filled 2022-02-10: qty 1

## 2022-02-10 MED ORDER — CEFDINIR 300 MG PO CAPS
300.0000 mg | ORAL_CAPSULE | Freq: Two times a day (BID) | ORAL | 0 refills | Status: DC
  Filled 2022-02-10: qty 3, 2d supply, fill #0

## 2022-02-10 NOTE — Plan of Care (Signed)
  Problem: Coping: Goal: Ability to adjust to condition or change in health will improve Outcome: Progressing   Problem: Health Behavior/Discharge Planning: Goal: Ability to manage health-related needs will improve Outcome: Progressing

## 2022-02-10 NOTE — Discharge Summary (Signed)
Physician Discharge Summary  Jordan Wells LXB:[id number] DOB: [birth year] DOA: 02/04/2022  PCP: Delorise Shiner, MD  Admit date: 02/04/2022 Discharge date: 02/10/2022  Admitted From: Home Disposition:  Home  Recommendations for Outpatient Follow-up:  Follow up with PCP in 1-2 weeks Please obtain BMP/CBC in one week Follow-up with pulmonary in 2 to 3 months will need a follow-up CT for his right pulmonary nodule   Home Health:Yes Equipment/Devices:None  Discharge Condition:Stable CODE STATUS:Full Diet recommendation: Heart Healthy  Brief/Interim Summary: 76 y.o. male past medical history chronic respiratory failure with hypoxia on 4 L of oxygen, COPD, essential hypertension, chronic diastolic heart failure tobacco user, recurrent pleural effusions status post bronchoscopy and thoracoscopy with pleural biopsy, currently with a Pleurx catheter placed at [facility] pleural fluid was negative for malignant cells, non-Hodgkin's lymphoma status postlumpectomy and no chemotherapy comes in with 3 to 4 days of progressive dyspnea nonproductive cough requiring 6 L of oxygen at home in the ED was tripoding noted to be in A-fib rate controlled chest x-ray showed atelectasis PCCM was consulted underwent bronchoscopy on 02/07/2022, transferred to the ICU under their care on BiPAP.   Significant events: 10/21 Admit with SOB, LLL atelectasis/PAN 10/22 pulm consult. Incr pulm hygiene 10/23 possible bronch pending CXR 10/24 bronch in endo, large mucus plug removed from left lung, drowsy after, requiring BIPAP 10/24 CXR improved left lung aeration following bronchoscopy  Discharge Diagnoses:  Principal Problem:   Acute on chronic respiratory failure with hypoxemia (HCC) Active Problems:   Diabetes mellitus type 2 in obese (HCC)   CAD (coronary artery disease)   Chronic atrial fibrillation (HCC)   Morbid obesity ([place])   Hypoxia   Other hyperlipidemia   Lung nodule   Pressure injury of  skin  Acute on chronic respiratory failure with hypoxia/recurrent pleural effusion/left lower lobe infiltrates: Became acutely hypoxic despite treatment pulmonary critical care, chest x-ray was done that showed a collapsed lung.  Bronchoscopy was performed and was found to have a big mucous plug. He was transitioned to BiPAP into 4 L of oxygen and his saturations remained stable. Physical therapy was consulted recommended home health PT.  Community-acquired pneumonia: He was treated empirically with antibiotics to complete a 7-day course total as an outpatient.  COPD exacerbation/chronic respiratory failure with hypoxia: Needed BiPAP temporarily once imaging was done and bronchoscopy performed he returned back to his 4 L which is his baseline. He was continued on inhalers. He will continue steroids taper as an outpatient.  Paroxysmal atrial fibrillation: Rate control continue Eliquis no changes made.  Diabetes mellitus type 2 with hyperglycemia uncontrolled: With an A1c of 11.5, at home he was only on long-acting insulin no short acting, I did discuss with him and his son and we decided to decrease the long-acting and start him on short acting with meals 5 units.    Chronic diastolic heart failure: No evidence of exacerbation no changes made to his medication.  Essential hypertension: Continue metoprolol Lasix no changes made.  Discharge Instructions  Discharge Instructions     Diet - low sodium heart healthy   Complete by: As directed    Discharge wound care:   Complete by: As directed    Per wound care instructions   Increase activity slowly   Complete by: As directed       Allergies as of 02/10/2022   No Known Allergies      Medication List     STOP taking these medications    lisinopril 20 MG tablet  Commonly known as: ZESTRIL       TAKE these medications    acetaminophen 325 MG tablet Commonly known as: TYLENOL Take 2 tablets (650 mg total) by mouth  every 6 (six) hours as needed for mild pain, fever or headache.   atorvastatin 80 MG tablet Commonly known as: LIPITOR Take 80 mg by mouth at bedtime.   cefdinir 300 MG capsule Commonly known as: OMNICEF Take 1 capsule (300 mg total) by mouth every 12 (twelve) hours.   doxycycline 100 MG tablet Commonly known as: VIBRA-TABS Take 1 tablet (100 mg total) by mouth every 12 (twelve) hours.   Eliquis 5 MG Tabs tablet Generic drug: apixaban Take 5 mg by mouth 2 (two) times daily.   furosemide 20 MG tablet Commonly known as: LASIX Take 20 mg by mouth 2 (two) times daily.   gabapentin 600 MG tablet Commonly known as: NEURONTIN Take 600 mg by mouth at bedtime.   insulin glargine 100 UNIT/ML injection Commonly known as: LANTUS Inject 0.4 mLs (40 Units total) into the skin at bedtime. What changed: how much to take   metoprolol tartrate 25 MG tablet Commonly known as: LOPRESSOR Take 12.5 mg by mouth in the morning.   NovoLOG PenFill cartridge Generic drug: insulin aspart Inject 5 Units into the skin 3 (three) times daily with meals.   potassium chloride SA 20 MEQ tablet Commonly known as: KLOR-CON M Take 20 mEq by mouth 2 (two) times daily.   predniSONE 10 MG tablet Commonly known as: DELTASONE Takes 63 tablets for 1 days, then 2 tabs for 1 days, then 1 tab for 1 days, and then stop.   silver sulfADIAZINE 1 % cream Commonly known as: SILVADENE Apply 1 Application topically daily as needed (sores).               Discharge Care Instructions  (From admission, onward)           Start     Ordered   02/10/22 0000  Discharge wound care:       Comments: Per wound care instructions   02/10/22 0940            No Known Allergies  Consultations: Pulmonary and critical care   Procedures/Studies: DG CHEST PORT 1 VIEW  Result Date: 02/07/2022 CLINICAL DATA:  Status post park ostomy.  Respiratory distress. EXAM: PORTABLE CHEST 1 VIEW COMPARISON:   02/06/2022.  CT, 02/05/2022. FINDINGS: Improved lung aeration. Left mid and upper lung is now mostly aerated. There is persistent opacity at the left lung base. Hazy opacity also persists at the right lung base. No pneumothorax. IMPRESSION: 1. Improved left lung aeration following bronchoscopy. 2. No evidence of a procedure complication.  No pneumothorax. Electronically Signed   By: Lajean Manes M.D.   On: 02/07/2022 15:24   DG CHEST PORT 1 VIEW  Result Date: 02/06/2022 CLINICAL DATA:  Left lung atelectasis EXAM: PORTABLE CHEST 1 VIEW COMPARISON:  Chest x-ray dated February 04, 2022 FINDINGS: Cardiac and mediastinal contours are not visualized. Unchanged complete opacification of the left hemithorax. Small right pleural effusion and right basilar atelectasis. No evidence of pneumothorax. IMPRESSION: Unchanged complete opacification of the left hemithorax, likely due to left lung collapse. Electronically Signed   By: Yetta Glassman M.D.   On: 02/06/2022 14:58   ECHOCARDIOGRAM COMPLETE  Result Date: 02/05/2022    ECHOCARDIOGRAM REPORT   Patient Name:   DEACON GADBOIS Date of Exam: 02/05/2022 Medical Rec #:  [id number]  Height:       70.0 in Accession #:    [id number]     Weight:       264.6 lb Date of Birth:  03/03/1946      BSA:          2.351 m Patient Age:    76 years       BP:           115/82 mmHg Patient Gender: M              HR:           97 bpm. Exam Location:  Inpatient Procedure: 2D Echo, Color Doppler and Cardiac Doppler Indications:    congestive heart failure  History:        Patient has prior history of Echocardiogram examinations. CAD,                 Arrythmias:Atrial Fibrillation; Risk Factors:Diabetes and                 Dyslipidemia.  Sonographer:    Johny Chess RDCS Referring Phys: [id number] ANAND D HONGALGI  Sonographer Comments: Technically difficult study due to poor echo windows, suboptimal subcostal window and patient is obese. Image acquisition challenging due to patient body  habitus and Image acquisition challenging due to respiratory motion. IMPRESSIONS  1. Left ventricular ejection fraction, by estimation, is 55 to 60%. The left ventricle has normal function. The left ventricle has no regional wall motion abnormalities. There is mild left ventricular hypertrophy. Left ventricular diastolic parameters are indeterminate.  2. Right ventricular systolic function was not well visualized. The right ventricular size is not well visualized. Tricuspid regurgitation signal is inadequate for assessing PA pressure.  3. The mitral valve was not well visualized. No evidence of mitral valve regurgitation. No evidence of mitral stenosis.  4. The aortic valve was not well visualized. There is mild calcification of the aortic valve. There is mild thickening of the aortic valve. Aortic valve regurgitation is not visualized. Mild aortic valve stenosis. Aortic valve mean gradient measures 9.5  mmHg. Aortic valve peak gradient measures 17.1 mmHg. Aortic valve area, by VTI measures 1.55 cm.  5. Aortic dilatation noted. There is mild dilatation of the ascending aorta, measuring 36 mm. FINDINGS  Left Ventricle: Left ventricular ejection fraction, by estimation, is 55 to 60%. The left ventricle has normal function. The left ventricle has no regional wall motion abnormalities. Definity contrast agent was given IV to delineate the left ventricular  endocardial borders. The left ventricular internal cavity size was normal in size. There is mild left ventricular hypertrophy. Left ventricular diastolic parameters are indeterminate. Right Ventricle: The right ventricular size is not well visualized. Right vetricular wall thickness was not well visualized. Right ventricular systolic function was not well visualized. Tricuspid regurgitation signal is inadequate for assessing PA pressure. Left Atrium: Left atrial size was not well visualized. Right Atrium: Right atrial size was not well visualized. Pericardium: The  pericardium was not well visualized. Mitral Valve: The mitral valve was not well visualized. There is mild thickening of the mitral valve leaflet(s). There is mild calcification of the mitral valve leaflet(s). Mild mitral annular calcification. No evidence of mitral valve regurgitation. No evidence of mitral valve stenosis. MV peak gradient, 9.1 mmHg. The mean mitral valve gradient is 3.0 mmHg. Tricuspid Valve: The tricuspid valve is not well visualized. Tricuspid valve regurgitation is not demonstrated. No evidence of tricuspid stenosis. Aortic Valve: The aortic valve was not well visualized.  There is mild calcification of the aortic valve. There is mild thickening of the aortic valve. There is mild aortic valve annular calcification. Aortic valve regurgitation is not visualized. Mild aortic stenosis is present. Aortic valve mean gradient measures 9.5 mmHg. Aortic valve peak gradient measures 17.1 mmHg. Aortic valve area, by VTI measures 1.55 cm. Pulmonic Valve: The pulmonic valve was not well visualized. Pulmonic valve regurgitation is not visualized. No evidence of pulmonic stenosis. Aorta: The aortic root is normal in size and structure and aortic dilatation noted. There is mild dilatation of the ascending aorta, measuring 36 mm. Venous: The inferior vena cava was not well visualized. IAS/Shunts: The interatrial septum was not well visualized.  LEFT VENTRICLE PLAX 2D LVIDd:         4.50 cm LVIDs:         3.50 cm LV PW:         1.20 cm LV IVS:        1.00 cm LVOT diam:     2.40 cm LV SV:         53 LV SV Index:   22 LVOT Area:     4.52 cm  RIGHT VENTRICLE TAPSE (M-mode): 1.9 cm LEFT ATRIUM           Index        RIGHT ATRIUM           Index LA diam:      4.20 cm 1.79 cm/m   RA Area:     27.80 cm LA Vol (A4C): 99.0 ml 42.11 ml/m  RA Volume:   99.30 ml  42.24 ml/m  AORTIC VALVE AV Area (Vmax):    1.55 cm AV Area (Vmean):   1.46 cm AV Area (VTI):     1.55 cm AV Vmax:           207.00 cm/s AV Vmean:           141.000 cm/s AV VTI:            0.340 m AV Peak Grad:      17.1 mmHg AV Mean Grad:      9.5 mmHg LVOT Vmax:         70.70 cm/s LVOT Vmean:        45.367 cm/s LVOT VTI:          0.116 m LVOT/AV VTI ratio: 0.34  AORTA Ao Root diam: 3.60 cm Ao Asc diam:  3.60 cm MITRAL VALVE MV Area VTI:  1.72 cm   SHUNTS MV Peak grad: 9.1 mmHg   Systemic VTI:  0.12 m MV Mean grad: 3.0 mmHg   Systemic Diam: 2.40 cm MV Vmax:      1.51 m/s MV Vmean:     67.9 cm/s Carlyle Dolly MD Electronically signed by Carlyle Dolly MD Signature Date/Time: 02/05/2022/4:11:54 PM    Final    CT CHEST WO CONTRAST  Result Date: 02/05/2022 CLINICAL DATA:  76 year old male with malignant pleural effusion suspected. EXAM: CT CHEST WITHOUT CONTRAST TECHNIQUE: Multidetector CT imaging of the chest was performed following the standard protocol without IV contrast. RADIATION DOSE REDUCTION: This exam was performed according to the departmental dose-optimization program which includes automated exposure control, adjustment of the mA and/or kV according to patient size and/or use of iterative reconstruction technique. COMPARISON:  Chest radiographs 02/04/2022 and earlier. Report of [facility] chest CTA 03/11/2019 (no images available). FINDINGS: Cardiovascular: Extensive coronary artery calcified atherosclerosis and/or stent. Cardiac size remains within normal limits. No  pericardial effusion. Calcified aortic atherosclerosis. Vascular patency is not evaluated in the absence of IV contrast. Mediastinum/Nodes: Enlarged but nonspecific 18 mm right paratracheal lymph node on series 2, image 41. Otherwise fairly small, mostly subcentimeter mediastinal lymph nodes are evident in the absence of contrast. Hilar lymph nodes are not well evaluated. Lungs/Pleura: Complete left lower lobe combined collapse and consolidation. Opacified and atelectatic left mainstem bronchus on series 3, image 69. No significant left pleural effusion. Mediastinal shift to  the left. Contralateral right lung confluent and dependent lower lobe opacity which could be atelectasis or consolidation. Less pronounced moderate right middle lobe atelectasis. Underlying right lung volume is low. But the major right lung airways remain patent. Superimposed spiculated right apical lung nodule is 2.1 cm on series 3, image 44. Upper Abdomen: Negative visible noncontrast liver, gallbladder, adrenal glands. Surgically absent spleen. Fatty atrophied pancreas. Calcified atherosclerosis including renal artery calcifications. Visible kidneys appear nonobstructed. No free air, free fluid, or dilated bowel in the visible upper abdomen. Musculoskeletal: Diffuse idiopathic skeletal hyperostosis (DISH). Bulky anterior endplate osteophytes and ankylosis at the cervicothoracic junction. Flowing thoracic endplate osteophytes with multilevel thoracic interbody ankylosis. Bulky and severe degeneration at the left glenohumeral joint. Chronic left 1st through 3rd rib fractures. No destructive or suspicious osseous lesion identified. IMPRESSION: 1. No significant pleural effusion. Complete combined collapse and consolidation of the Left Lung with an atelectatic and opacified left mainstem bronchus. Less pronounced dependent Right Lower Lobe consolidation versus atelectasis. Aspiration is possible.  Pulmonary toilet may be valuable. 2. Superimposed indeterminate spiculated 2.1 cm right apical lung nodule and enlarged 1.8 cm right paratracheal lymph node. Recommend referral to [facility]). Consider one of the following in 3 months for both low-risk and high-risk individuals: (a) repeat chest CT, (b) follow-up PET-CT, or (c) tissue sampling. This recommendation follows the consensus statement: Guidelines for Management of Incidental Pulmonary Nodules Detected on CT Images: From the Fleischner Society 2017; Radiology 2017; 284:228-243. 3. Post splenectomy. Diffuse idiopathic skeletal  hyperostosis (DISH). Extensive coronary artery calcified atherosclerosis or stent. Aortic Atherosclerosis (ICD10-I70.0). Electronically Signed   By: Genevie Ann M.D.   On: 02/05/2022 09:14   DG Chest 2 View  Result Date: 02/04/2022 CLINICAL DATA:  Chest pain.  Respiratory distress. EXAM: CHEST - 2 VIEW COMPARISON:  02/04/2022 FINDINGS: Shallow inspiration. There is diffuse white out of the left chest, progressing since previous study. This likely indicates either or combination of pleural effusion, atelectasis, and/or consolidation. There is diffuse perihilar infiltration in the right lung, also progressing. This could indicate edema or pneumonia. Aspiration would also be a possibility. Heart size is obscured by the parenchymal process. No pneumothorax. IMPRESSION: Complete opacification of the left hemithorax with perihilar infiltration on the right, progressing since previous study. Electronically Signed   By: Lucienne Capers M.D.   On: 02/04/2022 22:23   DG Chest Port 1 View  Result Date: 02/04/2022 CLINICAL DATA:  SOB EXAM: PORTABLE CHEST 1 VIEW COMPARISON:  Chest x-ray 10/18/2020 FINDINGS: The heart and mediastinal contours are not well visualized due to patient positioning and lung findings. Consolidation of the majority of the left lung. Airspace and interstitial opacities of the right lung most prominent at the lung bases. Likely bilateral pleural effusions. No pneumothorax. No acute osseous abnormality. Likely old healed left rib fracture. Severe degenerative changes of the left shoulder. IMPRESSION: 1. Consolidation of the majority of the left lung. Airspace and interstitial opacities of the right lung most prominent at the lung bases. 2.  Likely bilateral pleural effusions. 3. The heart and mediastinal contours are not well visualized due to patient positioning and lung findings. 4. Recommend repeat PA and lateral view of the chest with improved inspiratory effort and positioning. If CT chest  obtained, please obtain with intravenous contrast. Electronically Signed   By: Iven Finn M.D.   On: 02/04/2022 20:59   (Echo, Carotid, EGD, Colonoscopy, ERCP)    Subjective: No complaints  Discharge Exam: Vitals:   02/10/22 0350 02/10/22 0748  BP: 94/72 92/71  Pulse: 77 74  Resp: 20 18  Temp: 98.7 F (37.1 C) 98.5 F (36.9 C)  SpO2: 90% 92%   Vitals:   02/09/22 2311 02/10/22 0350 02/10/22 0500 02/10/22 0748  BP: 110/76 94/72  92/71  Pulse: 81 77  74  Resp: _0 Temp: 97.6 F (36.4 C) 98.7 F (37.1 C)  98.5 F (36.9 C)  TempSrc: Oral Oral  Oral  SpO2: 90% 90%  92%  Weight:   126.9 kg   Height:        General: Pt is alert, awake, not in acute distress Cardiovascular: RRR, S1/S2 +, no rubs, no gallops Respiratory: CTA bilaterally, no wheezing, no rhonchi Abdominal: Soft, NT, ND, bowel sounds + Extremities: no edema, no cyanosis    The results of significant diagnostics from this hospitalization (including imaging, microbiology, ancillary and laboratory) are listed below for reference.     Microbiology: Recent Results (from the past 240 hour(s))  Resp Panel by RT-PCR (Flu A&B, Covid) Anterior Nasal Swab     Status: None   Collection Time: 02/04/22  8:38 PM   Specimen: Anterior Nasal Swab  Result Value Ref Range Status   SARS Coronavirus 2 by RT PCR NEGATIVE NEGATIVE Final    Comment: (NOTE) SARS-CoV-2 target nucleic acids are NOT DETECTED.  The SARS-CoV-2 RNA is generally detectable in upper respiratory specimens during the acute phase of infection. The lowest concentration of SARS-CoV-2 viral copies this assay can detect is 138 copies/mL. A negative result does not preclude SARS-Cov-2 infection and should not be used as the sole basis for treatment or other patient management decisions. A negative result may occur with  improper specimen collection/handling, submission of specimen other than nasopharyngeal swab, presence of viral mutation(s)  within the areas targeted by this assay, and inadequate number of viral copies(<138 copies/mL). A negative result must be combined with clinical observations, patient history, and epidemiological information. The expected result is Negative.  Fact Sheet for Patients:  EntrepreneurPulse.com.au  Fact Sheet for Healthcare Providers:  IncredibleEmployment.be  This test is no t yet approved or cleared by the [place] FDA and  has been authorized for detection and/or diagnosis of SARS-CoV-2 by FDA under an Emergency Use Authorization (EUA). This EUA will remain  in effect (meaning this test can be used) for the duration of the COVID-19 declaration under Section 564(b)(1) of the Act, 21 U.S.C.section 360bbb-3(b)(1), unless the authorization is terminated  or revoked sooner.       Influenza A by PCR NEGATIVE NEGATIVE Final   Influenza B by PCR NEGATIVE NEGATIVE Final    Comment: (NOTE) The Xpert Xpress SARS-CoV-2/FLU/RSV plus assay is intended as an aid in the diagnosis of influenza from Nasopharyngeal swab specimens and should not be used as a sole basis for treatment. Nasal washings and aspirates are unacceptable for Xpert Xpress SARS-CoV-2/FLU/RSV testing.  Fact Sheet for Patients: EntrepreneurPulse.com.au  Fact Sheet for Healthcare Providers: IncredibleEmployment.be  This test is not yet approved  or cleared by the [place] and has been authorized for detection and/or diagnosis of SARS-CoV-2 by FDA under an Emergency Use Authorization (EUA). This EUA will remain in effect (meaning this test can be used) for the duration of the COVID-19 declaration under Section 564(b)(1) of the Act, 21 U.S.C. section 360bbb-3(b)(1), unless the authorization is terminated or revoked.  Performed at [facility], [place] [address], [place], [place] [address]   Culture, Respiratory w Gram Stain     Status:  None (Preliminary result)   Collection Time: 02/07/22  2:29 PM   Specimen: Bronchoalveolar Lavage; Respiratory  Result Value Ref Range Status   Specimen Description BRONCHIAL ALVEOLAR LAVAGE  Final   Special Requests NONE  Final   Gram Stain   Final    RARE WBC PRESENT,BOTH PMN AND MONONUCLEAR NO ORGANISMS SEEN    Culture   Final    CULTURE REINCUBATED FOR BETTER GROWTH Performed at [facility], [address], [place], [place] [address]    Report Status PENDING  Incomplete     Labs: BNP (last 3 results) Recent Labs    02/04/22 2040  BNP 383.2*   Basic Metabolic Panel: Recent Labs  Lab 02/04/22 2040 02/04/22 2050 02/04/22 2358 02/05/22 0239 02/06/22 0400 02/07/22 0648 02/08/22 0716  NA 139 136  136  --  138 141 138 139  K 5.1 4.8  4.8  --  4.6 4.0 4.3 4.2  CL 97* 95*  --  97* 96* 97* 94*  CO2 29  --   --  30 32 31 36*  GLUCOSE 390* 404*  --  289* 44* 165* 270*  BUN 21 24*  --  _0 CREATININE 0.92 0.80 0.88 0.85 0.79 0.67 0.91  CALCIUM 8.9  --   --  8.9 9.0 9.1 9.0   Liver Function Tests: Recent Labs  Lab 02/04/22 2040 02/05/22 0239  AST 18 14*  ALT 17 15  ALKPHOS 74 67  BILITOT 0.5 0.7  PROT 5.9* 5.8*  ALBUMIN 3.4* 3.5   No results for input(s): "LIPASE", "AMYLASE" in the last 168 hours. No results for input(s): "AMMONIA" in the last 168 hours. CBC: Recent Labs  Lab 02/04/22 2040 02/04/22 2050 02/05/22 0239 02/08/22 0716  WBC 13.8*  --  13.2* 14.1*  NEUTROABS 10.7*  --   --   --   HGB 13.7 15.6  15.3 13.6 13.4  HCT 42.4 46.0  45.0 44.7 41.2  MCV 98.8  --  102.1* 98.3  PLT 229  --  227 262   Cardiac Enzymes: No results for input(s): "CKTOTAL", "CKMB", "CKMBINDEX", "TROPONINI" in the last 168 hours. BNP: Invalid input(s): "POCBNP" CBG: Recent Labs  Lab 02/09/22 0755 02/09/22 1214 02/09/22 1605 02/09/22 2102 02/10/22 0750  GLUCAP 147* 322* 254* 188* 159*   D-Dimer No results for input(s): "DDIMER" in the last 72  hours. Hgb A1c No results for input(s): "HGBA1C" in the last 72 hours. Lipid Profile No results for input(s): "CHOL", "HDL", "LDLCALC", "TRIG", "CHOLHDL", "LDLDIRECT" in the last 72 hours. Thyroid function studies No results for input(s): "TSH", "T4TOTAL", "T3FREE", "THYROIDAB" in the last 72 hours.  Invalid input(s): "FREET3" Anemia work up No results for input(s): "VITAMINB12", "FOLATE", "FERRITIN", "TIBC", "IRON", "RETICCTPCT" in the last 72 hours. Urinalysis    Component Value Date/Time   COLORURINE YELLOW 02/04/2022 2310   APPEARANCEUR CLEAR 02/04/2022 2310   LABSPEC 1.018 02/04/2022 2310   PHURINE 5.0 02/04/2022 2310  GLUCOSEU >=500 (A) 02/04/2022 2310   HGBUR NEGATIVE 02/04/2022 2310   BILIRUBINUR NEGATIVE 02/04/2022 2310   KETONESUR NEGATIVE 02/04/2022 2310   PROTEINUR 30 (A) 02/04/2022 2310   NITRITE NEGATIVE 02/04/2022 2310   LEUKOCYTESUR NEGATIVE 02/04/2022 2310   Sepsis Labs Recent Labs  Lab 02/04/22 2040 02/05/22 0239 02/08/22 0716  WBC 13.8* 13.2* 14.1*   Microbiology Recent Results (from the past 240 hour(s))  Resp Panel by RT-PCR (Flu A&B, Covid) Anterior Nasal Swab     Status: None   Collection Time: 02/04/22  8:38 PM   Specimen: Anterior Nasal Swab  Result Value Ref Range Status   SARS Coronavirus 2 by RT PCR NEGATIVE NEGATIVE Final    Comment: (NOTE) SARS-CoV-2 target nucleic acids are NOT DETECTED.  The SARS-CoV-2 RNA is generally detectable in upper respiratory specimens during the acute phase of infection. The lowest concentration of SARS-CoV-2 viral copies this assay can detect is 138 copies/mL. A negative result does not preclude SARS-Cov-2 infection and should not be used as the sole basis for treatment or other patient management decisions. A negative result may occur with  improper specimen collection/handling, submission of specimen other than nasopharyngeal swab, presence of viral mutation(s) within the areas targeted by this assay,  and inadequate number of viral copies(<138 copies/mL). A negative result must be combined with clinical observations, patient history, and epidemiological information. The expected result is Negative.  Fact Sheet for Patients:  EntrepreneurPulse.com.au  Fact Sheet for Healthcare Providers:  IncredibleEmployment.be  This test is no t yet approved or cleared by the [place] FDA and  has been authorized for detection and/or diagnosis of SARS-CoV-2 by FDA under an Emergency Use Authorization (EUA). This EUA will remain  in effect (meaning this test can be used) for the duration of the COVID-19 declaration under Section 564(b)(1) of the Act, 21 U.S.C.section 360bbb-3(b)(1), unless the authorization is terminated  or revoked sooner.       Influenza A by PCR NEGATIVE NEGATIVE Final   Influenza B by PCR NEGATIVE NEGATIVE Final    Comment: (NOTE) The Xpert Xpress SARS-CoV-2/FLU/RSV plus assay is intended as an aid in the diagnosis of influenza from Nasopharyngeal swab specimens and should not be used as a sole basis for treatment. Nasal washings and aspirates are unacceptable for Xpert Xpress SARS-CoV-2/FLU/RSV testing.  Fact Sheet for Patients: EntrepreneurPulse.com.au  Fact Sheet for Healthcare Providers: IncredibleEmployment.be  This test is not yet approved or cleared by the [place] FDA and has been authorized for detection and/or diagnosis of SARS-CoV-2 by FDA under an Emergency Use Authorization (EUA). This EUA will remain in effect (meaning this test can be used) for the duration of the COVID-19 declaration under Section 564(b)(1) of the Act, 21 U.S.C. section 360bbb-3(b)(1), unless the authorization is terminated or revoked.  Performed at [facility], [place] [address] [place], [place] [address]   Culture, Respiratory w Gram Stain     Status: None (Preliminary result)   Collection  Time: 02/07/22  2:29 PM   Specimen: Bronchoalveolar Lavage; Respiratory  Result Value Ref Range Status   Specimen Description BRONCHIAL ALVEOLAR LAVAGE  Final   Special Requests NONE  Final   Gram Stain   Final    RARE WBC PRESENT,BOTH PMN AND MONONUCLEAR NO ORGANISMS SEEN    Culture   Final    CULTURE REINCUBATED FOR BETTER GROWTH Performed at [facility], [address], [place], [place] [address]    Report Status PENDING  Incomplete  SIGNED:   Charlynne Cousins, MD  [facility] 02/10/2022, 9:41 AM Pager   If 7PM-7AM, please contact night-coverage [email or web address] Password [email or web address]

## 2022-05-11 ENCOUNTER — Emergency Department (HOSPITAL_COMMUNITY): Payer: Medicare Other

## 2022-05-11 ENCOUNTER — Other Ambulatory Visit: Payer: Self-pay

## 2022-05-11 ENCOUNTER — Inpatient Hospital Stay (HOSPITAL_COMMUNITY): Payer: Medicare Other

## 2022-05-11 ENCOUNTER — Inpatient Hospital Stay (HOSPITAL_COMMUNITY)
Admission: EM | Admit: 2022-05-11 | Discharge: 2022-05-16 | DRG: 291 | Disposition: A | Discharge status: Hospice | Payer: Medicare Other | Attending: Family Medicine | Admitting: Family Medicine

## 2022-05-11 ENCOUNTER — Encounter (HOSPITAL_COMMUNITY): Payer: Self-pay

## 2022-05-11 DIAGNOSIS — R911 Solitary pulmonary nodule: Secondary | ICD-10-CM | POA: Diagnosis present

## 2022-05-11 DIAGNOSIS — D72829 Elevated white blood cell count, unspecified: Secondary | ICD-10-CM | POA: Diagnosis present

## 2022-05-11 DIAGNOSIS — I3481 Nonrheumatic mitral (valve) annulus calcification: Secondary | ICD-10-CM | POA: Diagnosis present

## 2022-05-11 DIAGNOSIS — J189 Pneumonia, unspecified organism: Secondary | ICD-10-CM | POA: Diagnosis present

## 2022-05-11 DIAGNOSIS — J9811 Atelectasis: Secondary | ICD-10-CM | POA: Diagnosis present

## 2022-05-11 DIAGNOSIS — Z515 Encounter for palliative care: Secondary | ICD-10-CM | POA: Diagnosis not present

## 2022-05-11 DIAGNOSIS — J440 Chronic obstructive pulmonary disease with acute lower respiratory infection: Secondary | ICD-10-CM | POA: Diagnosis present

## 2022-05-11 DIAGNOSIS — L304 Erythema intertrigo: Secondary | ICD-10-CM | POA: Diagnosis present

## 2022-05-11 DIAGNOSIS — R4182 Altered mental status, unspecified: Secondary | ICD-10-CM | POA: Diagnosis present

## 2022-05-11 DIAGNOSIS — Z8616 Personal history of COVID-19: Secondary | ICD-10-CM | POA: Diagnosis not present

## 2022-05-11 DIAGNOSIS — J449 Chronic obstructive pulmonary disease, unspecified: Secondary | ICD-10-CM | POA: Diagnosis present

## 2022-05-11 DIAGNOSIS — Z66 Do not resuscitate: Secondary | ICD-10-CM | POA: Diagnosis not present

## 2022-05-11 DIAGNOSIS — E669 Obesity, unspecified: Secondary | ICD-10-CM | POA: Diagnosis present

## 2022-05-11 DIAGNOSIS — E1165 Type 2 diabetes mellitus with hyperglycemia: Secondary | ICD-10-CM

## 2022-05-11 DIAGNOSIS — Z8572 Personal history of non-Hodgkin lymphomas: Secondary | ICD-10-CM

## 2022-05-11 DIAGNOSIS — I509 Heart failure, unspecified: Principal | ICD-10-CM

## 2022-05-11 DIAGNOSIS — L97522 Non-pressure chronic ulcer of other part of left foot with fat layer exposed: Secondary | ICD-10-CM | POA: Diagnosis present

## 2022-05-11 DIAGNOSIS — T501X6A Underdosing of loop [high-ceiling] diuretics, initial encounter: Secondary | ICD-10-CM | POA: Diagnosis present

## 2022-05-11 DIAGNOSIS — I5033 Acute on chronic diastolic (congestive) heart failure: Secondary | ICD-10-CM | POA: Diagnosis present

## 2022-05-11 DIAGNOSIS — Z6838 Body mass index (BMI) 38.0-38.9, adult: Secondary | ICD-10-CM

## 2022-05-11 DIAGNOSIS — J181 Lobar pneumonia, unspecified organism: Secondary | ICD-10-CM | POA: Insufficient documentation

## 2022-05-11 DIAGNOSIS — Z89421 Acquired absence of other right toe(s): Secondary | ICD-10-CM

## 2022-05-11 DIAGNOSIS — I2510 Atherosclerotic heart disease of native coronary artery without angina pectoris: Secondary | ICD-10-CM | POA: Diagnosis present

## 2022-05-11 DIAGNOSIS — R471 Dysarthria and anarthria: Secondary | ICD-10-CM | POA: Diagnosis present

## 2022-05-11 DIAGNOSIS — R918 Other nonspecific abnormal finding of lung field: Secondary | ICD-10-CM

## 2022-05-11 DIAGNOSIS — Z794 Long term (current) use of insulin: Secondary | ICD-10-CM

## 2022-05-11 DIAGNOSIS — I110 Hypertensive heart disease with heart failure: Principal | ICD-10-CM | POA: Diagnosis present

## 2022-05-11 DIAGNOSIS — I482 Chronic atrial fibrillation, unspecified: Secondary | ICD-10-CM | POA: Diagnosis present

## 2022-05-11 DIAGNOSIS — E7800 Pure hypercholesterolemia, unspecified: Secondary | ICD-10-CM | POA: Diagnosis present

## 2022-05-11 DIAGNOSIS — Z7901 Long term (current) use of anticoagulants: Secondary | ICD-10-CM

## 2022-05-11 DIAGNOSIS — Z7189 Other specified counseling: Secondary | ICD-10-CM

## 2022-05-11 DIAGNOSIS — I252 Old myocardial infarction: Secondary | ICD-10-CM | POA: Diagnosis not present

## 2022-05-11 DIAGNOSIS — Z79899 Other long term (current) drug therapy: Secondary | ICD-10-CM

## 2022-05-11 DIAGNOSIS — Z87891 Personal history of nicotine dependence: Secondary | ICD-10-CM

## 2022-05-11 DIAGNOSIS — G9341 Metabolic encephalopathy: Secondary | ICD-10-CM

## 2022-05-11 DIAGNOSIS — E11621 Type 2 diabetes mellitus with foot ulcer: Secondary | ICD-10-CM | POA: Diagnosis present

## 2022-05-11 DIAGNOSIS — S81802A Unspecified open wound, left lower leg, initial encounter: Secondary | ICD-10-CM | POA: Insufficient documentation

## 2022-05-11 DIAGNOSIS — J9621 Acute and chronic respiratory failure with hypoxia: Secondary | ICD-10-CM | POA: Diagnosis present

## 2022-05-11 DIAGNOSIS — R7989 Other specified abnormal findings of blood chemistry: Secondary | ICD-10-CM

## 2022-05-11 DIAGNOSIS — Z9081 Acquired absence of spleen: Secondary | ICD-10-CM

## 2022-05-11 LAB — I-STAT VENOUS BLOOD GAS, ED
Acid-Base Excess: 13 mmol/L — ABNORMAL HIGH (ref 0.0–2.0)
Bicarbonate: 38.5 mmol/L — ABNORMAL HIGH (ref 20.0–28.0)
Calcium, Ion: 1.03 mmol/L — ABNORMAL LOW (ref 1.15–1.40)
HCT: 45 % (ref 39.0–52.0)
Hemoglobin: 15.3 g/dL (ref 13.0–17.0)
O2 Saturation: 99 %
Potassium: 4.4 mmol/L (ref 3.5–5.1)
Sodium: 134 mmol/L — ABNORMAL LOW (ref 135–145)
TCO2: 40 mmol/L — ABNORMAL HIGH (ref 22–32)
pCO2, Ven: 52.4 mmHg (ref 44–60)
pH, Ven: 7.475 — ABNORMAL HIGH (ref 7.25–7.43)
pO2, Ven: 149 mmHg — ABNORMAL HIGH (ref 32–45)

## 2022-05-11 LAB — PROTIME-INR
INR: 1.1 (ref 0.8–1.2)
Prothrombin Time: 14.1 seconds (ref 11.4–15.2)

## 2022-05-11 LAB — COMPREHENSIVE METABOLIC PANEL
ALT: 12 U/L (ref 0–44)
AST: 14 U/L — ABNORMAL LOW (ref 15–41)
Albumin: 3.3 g/dL — ABNORMAL LOW (ref 3.5–5.0)
Alkaline Phosphatase: 56 U/L (ref 38–126)
Anion gap: 11 (ref 5–15)
BUN: 12 mg/dL (ref 8–23)
CO2: 34 mmol/L — ABNORMAL HIGH (ref 22–32)
Calcium: 8.9 mg/dL (ref 8.9–10.3)
Chloride: 91 mmol/L — ABNORMAL LOW (ref 98–111)
Creatinine, Ser: 0.73 mg/dL (ref 0.61–1.24)
GFR, Estimated: >60 mL/min (ref >60)
Glucose, Bld: 344 mg/dL — ABNORMAL HIGH (ref 70–99)
Potassium: 4.3 mmol/L (ref 3.5–5.1)
Sodium: 136 mmol/L (ref 135–145)
Total Bilirubin: 1.1 mg/dL (ref 0.3–1.2)
Total Protein: 5.9 g/dL — ABNORMAL LOW (ref 6.5–8.1)

## 2022-05-11 LAB — URINALYSIS, ROUTINE W REFLEX MICROSCOPIC
Bilirubin Urine: NEGATIVE
Glucose, UA: 150 mg/dL — AB
Ketones, ur: NEGATIVE mg/dL
Nitrite: NEGATIVE
Protein, ur: NEGATIVE mg/dL
Specific Gravity, Urine: 1.009 (ref 1.005–1.030)
pH: 5 (ref 5.0–8.0)

## 2022-05-11 LAB — I-STAT CHEM 8, ED
BUN: 17 mg/dL (ref 8–23)
Calcium, Ion: 1.03 mmol/L — ABNORMAL LOW (ref 1.15–1.40)
Chloride: 92 mmol/L — ABNORMAL LOW (ref 98–111)
Creatinine, Ser: 0.5 mg/dL — ABNORMAL LOW (ref 0.61–1.24)
Glucose, Bld: 339 mg/dL — ABNORMAL HIGH (ref 70–99)
HCT: 45 % (ref 39.0–52.0)
Hemoglobin: 15.3 g/dL (ref 13.0–17.0)
Potassium: 4.3 mmol/L (ref 3.5–5.1)
Sodium: 135 mmol/L (ref 135–145)
TCO2: 39 mmol/L — ABNORMAL HIGH (ref 22–32)

## 2022-05-11 LAB — GLUCOSE, CAPILLARY
Glucose-Capillary: 182 mg/dL — ABNORMAL HIGH (ref 70–99)
Glucose-Capillary: 126 mg/dL — ABNORMAL HIGH (ref 70–99)

## 2022-05-11 LAB — BRAIN NATRIURETIC PEPTIDE: B Natriuretic Peptide: 299.1 pg/mL — ABNORMAL HIGH (ref 0.0–100.0)

## 2022-05-11 LAB — CBC WITH DIFFERENTIAL/PLATELET
Abs Immature Granulocytes: 0.08 10*3/uL — ABNORMAL HIGH (ref 0.00–0.07)
Basophils Absolute: 0.1 10*3/uL (ref 0.0–0.1)
Basophils Relative: 0 %
Eosinophils Absolute: 0.3 10*3/uL (ref 0.0–0.5)
Eosinophils Relative: 2 %
HCT: 44.5 % (ref 39.0–52.0)
Hemoglobin: 14.4 g/dL (ref 13.0–17.0)
Immature Granulocytes: 1 %
Lymphocytes Relative: 7 %
Lymphs Abs: 1.1 10*3/uL (ref 0.7–4.0)
MCH: 31.6 pg (ref 26.0–34.0)
MCHC: 32.4 g/dL (ref 30.0–36.0)
MCV: 97.6 fL (ref 80.0–100.0)
Monocytes Absolute: 2.1 10*3/uL — ABNORMAL HIGH (ref 0.1–1.0)
Monocytes Relative: 12 %
Neutro Abs: 13.5 10*3/uL — ABNORMAL HIGH (ref 1.7–7.7)
Neutrophils Relative %: 78 %
Platelets: 258 10*3/uL (ref 150–400)
RBC: 4.56 MIL/uL (ref 4.22–5.81)
RDW: 13.6 % (ref 11.5–15.5)
WBC: 17.1 10*3/uL — ABNORMAL HIGH (ref 4.0–10.5)
nRBC: 0 % (ref 0.0–0.2)

## 2022-05-11 LAB — BASIC METABOLIC PANEL
Anion gap: 11 (ref 5–15)
BUN: 16 mg/dL (ref 8–23)
CO2: 36 mmol/L — ABNORMAL HIGH (ref 22–32)
Calcium: 9.2 mg/dL (ref 8.9–10.3)
Chloride: 90 mmol/L — ABNORMAL LOW (ref 98–111)
Creatinine, Ser: 0.94 mg/dL (ref 0.61–1.24)
GFR, Estimated: >60 mL/min (ref >60)
Glucose, Bld: 243 mg/dL — ABNORMAL HIGH (ref 70–99)
Potassium: 4.4 mmol/L (ref 3.5–5.1)
Sodium: 137 mmol/L (ref 135–145)

## 2022-05-11 LAB — HEMOGLOBIN A1C
Hgb A1c MFr Bld: 12.8 % — ABNORMAL HIGH (ref 4.8–5.6)
Mean Plasma Glucose: 320.66 mg/dL

## 2022-05-11 LAB — TROPONIN I (HIGH SENSITIVITY)
Troponin I (High Sensitivity): 49 ng/L — ABNORMAL HIGH (ref <18)
Troponin I (High Sensitivity): 44 ng/L — ABNORMAL HIGH (ref <18)

## 2022-05-11 LAB — MAGNESIUM: Magnesium: 2 mg/dL (ref 1.7–2.4)

## 2022-05-11 LAB — TSH: TSH: 1.411 u[IU]/mL (ref 0.350–4.500)

## 2022-05-11 LAB — CBG MONITORING, ED: Glucose-Capillary: 451 mg/dL — ABNORMAL HIGH (ref 70–99)

## 2022-05-11 MED ORDER — ALBUTEROL SULFATE (2.5 MG/3ML) 0.083% IN NEBU: 2.5000 mg | INHALATION_SOLUTION | RESPIRATORY_TRACT | Status: DC | PRN

## 2022-05-11 MED ORDER — FUROSEMIDE 10 MG/ML IJ SOLN
40.0000 mg | Freq: Once | INTRAMUSCULAR | Status: AC
  Administered 2022-05-11: 40 mg via INTRAVENOUS
  Filled 2022-05-11: qty 4

## 2022-05-11 MED ORDER — INSULIN GLARGINE-YFGN 100 UNIT/ML ~~LOC~~ SOLN
25.0000 [IU] | Freq: Every day | SUBCUTANEOUS | Status: DC
  Administered 2022-05-11 – 2022-05-12 (×2): 25 [IU] via SUBCUTANEOUS
  Filled 2022-05-11 (×2): qty 0.25

## 2022-05-11 MED ORDER — ATORVASTATIN CALCIUM 80 MG PO TABS
80.0000 mg | ORAL_TABLET | Freq: Every day | ORAL | Status: DC
  Administered 2022-05-11 – 2022-05-13 (×3): 80 mg via ORAL
  Filled 2022-05-11 (×3): qty 1

## 2022-05-11 MED ORDER — APIXABAN 5 MG PO TABS
5.0000 mg | ORAL_TABLET | Freq: Two times a day (BID) | ORAL | Status: DC
  Administered 2022-05-11 – 2022-05-15 (×9): 5 mg via ORAL
  Filled 2022-05-11 (×9): qty 1

## 2022-05-11 MED ORDER — ZINC OXIDE 40 % EX OINT
TOPICAL_OINTMENT | Freq: Two times a day (BID) | CUTANEOUS | Status: DC | PRN
  Filled 2022-05-11: qty 57

## 2022-05-11 MED ORDER — METOPROLOL TARTRATE 12.5 MG HALF TABLET
12.5000 mg | ORAL_TABLET | Freq: Every morning | ORAL | Status: DC
  Administered 2022-05-11 – 2022-05-16 (×6): 12.5 mg via ORAL
  Filled 2022-05-11 (×6): qty 1

## 2022-05-11 MED ORDER — SODIUM CHLORIDE 0.9 % IV SOLN
500.0000 mg | INTRAVENOUS | Status: DC
  Administered 2022-05-11 – 2022-05-12 (×2): 500 mg via INTRAVENOUS
  Filled 2022-05-11 (×2): qty 5

## 2022-05-11 MED ORDER — SODIUM CHLORIDE 0.9 % IV SOLN
2.0000 g | INTRAVENOUS | Status: DC
  Administered 2022-05-11 – 2022-05-12 (×2): 2 g via INTRAVENOUS
  Filled 2022-05-11 (×2): qty 20

## 2022-05-11 MED ORDER — MEDIHONEY WOUND/BURN DRESSING EX PSTE
1.0000 | PASTE | Freq: Every day | CUTANEOUS | Status: DC
  Administered 2022-05-11 – 2022-05-16 (×5): 1 via TOPICAL
  Filled 2022-05-11: qty 44

## 2022-05-11 MED ORDER — KETOCONAZOLE 2 % EX CREA
TOPICAL_CREAM | Freq: Two times a day (BID) | CUTANEOUS | Status: DC | PRN
  Filled 2022-05-11: qty 15

## 2022-05-11 MED ORDER — INSULIN ASPART 100 UNIT/ML IJ SOLN
0.0000 [IU] | Freq: Three times a day (TID) | INTRAMUSCULAR | Status: DC
  Administered 2022-05-11: 15 [IU] via SUBCUTANEOUS
  Administered 2022-05-11 – 2022-05-12 (×2): 3 [IU] via SUBCUTANEOUS
  Administered 2022-05-12: 2 [IU] via SUBCUTANEOUS
  Administered 2022-05-13: 3 [IU] via SUBCUTANEOUS
  Administered 2022-05-13: 2 [IU] via SUBCUTANEOUS
  Administered 2022-05-13 – 2022-05-14 (×2): 5 [IU] via SUBCUTANEOUS

## 2022-05-11 NOTE — Consult Note (Addendum)
[facility] Nurse Consult Note: Reason for Consult: Consult requested for left foot wounds.  Performed remotely after review of progress notes and photos in the EMR. Records indicate he is followed as an outpatient by the [facility] but I could not locate any wound care notes from that facility which indicate his current topical treatment routine.  Wound type: Left anterior foot with patchy areas of full thickness wounds, separated by narrow intact skin bridges.  50% red and moist, 50% yellow.  Surrounding skin is dusky red/dry and crusted.  Dressing procedure/placement/frequency: Topical treatment orders provided for bedside nurses to perform as follows to assist with removal of nonviable tissue: Apply Medihoney to left foot wounds Q day, then cover with moist gauze and kerlex. Pt should resume present plan of care and followup with the [facility] after discharge. Please re-consult if further assistance is needed.  Thank-you,  Julien Girt MSN, [place], [place], [place], [place]

## 2022-05-11 NOTE — ED Notes (Signed)
Help[ get patient back in bed on the monitor patient is resting with call bell in reach and family at bedside

## 2022-05-11 NOTE — Progress Notes (Signed)
FMTS Interim Progress Note  S:Went to bedside to check on patient alongside Dr. Sabra Heck. Patient was asleep and sitter also present at bedside as patient was pulling out his lines earlier. Sitter woke patient up and he denies any concerns at this time. Reports that he feels better compared to when he was first admitted to the hospital. Denies chest pain or dyspnea. States that his baseline oxygen requirement is 3L at home.    O: BP 98/68 (BP Location: Left Arm)   Pulse 100   Temp 97.8 F (36.6 C) (Oral)   Resp 20   Ht '5\' 9"'$  (1.753 m)   Wt 119.3 kg   SpO2 97%   BMI 38.84 kg/m   General: Patient resting comfortably in bed, in no acute distress. CV: irregularly irregular rhythm, no murmurs or gallops auscultated Resp: mild crackles noted diffusely, no wheezing or rhonchi noted, no focal findings noted, breathing comfortably on 4L without evidence of respiratory distress Ext: left foot wounds noted, 2+ pitting edema noted bilaterally  A/P: Patient admitted for acute heart failure exacerbation, s/p LIV lasix 40 x1. Also treating for CAP coverage given opacity noted on CXR. Reassuringly patient's dyspnea much improved. Thus far has urinary output of 150 mL, called nurse to encourage [place] to maintain a record of this so that his lasix can be most appropriately redosed in the morning. Continue sitter as patient benefits from more persistent redirection. Vitals, orders and telemetry reviewed and appropriate at this time. Will continue to monitor. Remainder of plan per day team.   Donney Dice, DO 05/11/2022, 7:30 PM PGY-3, [facility] pager [phone]

## 2022-05-11 NOTE — H&P (Addendum)
Hospital Admission History and Physical Service Pager: [phone]  Patient name: Jordan Wells Medical record number: [id number] Date of Birth: [birth year] Age: 77 y.o. Gender: male  Primary Care Provider: Delorise Shiner, MD Consultants: None Code Status: Full Code Preferred Emergency Contact: Son Jerrid Forgette [phone]  Chief Complaint: Shortness of breath  Assessment and Plan: Dayan Desa is a 77 y.o. male presenting with shortness of breath. Differential for this patient's presentation of this includes CHF exacerbation in the setting of missed doses of his Lasix versus COPD exacerbation versus less likely pneumonia. Malignant process remains on differential as well given lung nodule that is demonstrated on exam today. CHF exacerbation symptoms likely given that he has missed several doses of his Lasix and has elevated BNP and evidence of volume overload on exam taken together with rapid symptomatic response to Lasix.  COPD exacerbation seems somewhat less likely given lack of focal wheeze and no increasing O2 requirement above his baseline.  Pneumonia less likely given symptoms better explained by CHF and without fever, productive cough, or other infectious symptoms, however must remain on the differential given elevated white count.    * Acute exacerbation of CHF (congestive heart failure) ([place]) Patient presenting with likely CHF exacerbation in the setting of not taking his Lasix at home. BNP up to 299.1.  Chest x-ray with bibasilar opacities and small pleural effusions which may reflect pulmonary edema, though per chart review it seems these effusions may be long-standing and chronic.  Known HFpEF, echo done as recently as October of last year.  Thankfully is not hypoxic on his home 4 L of O2 at this time. LVEF at the time was 55-60%.  No regional wall motion abnormalities.  Mild LVH.  Indeterminate diastolic parameters.  Poor visualization of the valves.   Good initial symptomatic  response to IV Lasix given in the emergency department.  Therefore will favor ongoing diuresis.  I am a bit concerned at his unwillingness to take his medication at home, suspect that if this behavior continues, he is at a high risk for future readmission.  From the history gathered from his son, I worry about his progressive immobility and subsequent weakness being a major barrier to him being able to care for himself at home. -Admit to medical telemetry, Dr. Andria Frames attending -Will redose Lasix 40 mg IV -Monitor electrolytes while diuresing, maintain K >4, magnesium >2 -TSH -Daily weights -Strict ins and outs -PT/OT to see   COPD (chronic obstructive pulmonary disease) (HCC) No evidence of acute exacerbation at this time, no wheeze.  He is maintaining O2 sats on his home 4 L of oxygen.  Per chart review, it appears he is not on any controller meds and just uses albuterol on an as-needed basis. -Continue as needed albuterol -Supplemental O2 as needed  Diabetes mellitus type 2 in obese (HCC) Prescribed to use 50 units of long-acting insulin daily and 5 units of short acting insulin with meals at home.  However, inconsistent medication use as documented in the HPI.  Uncertain how much insulin he has actually been taking well at home. Not on SGLT2i due to history of Fournier's gangrene.  -Will check an A1c -25 units of long-acting insulin daily for now -Moderate sliding scale insulin with CBG checks  Intertrigo Fairly advanced, likely secondary to hygiene and immobility. Does have a history of Fournier's gangrene, do not believe he is gangrenous at this time.  -Maintain clean and dry area -Ketoconazole cream -Zinc oxide   Lung  nodule Had been seen previously on CT Chest 02/05/22. Recommendation at that time was for repeat chest CT, PET-CT, or tissue sampling in 3 months. - Will obtain a CT Chest - If any changes, will consult pulmonology for tissue sampling   Elevated troponin Trop  49>44, Suspect demand in the setting of HFpEF exacerbation. No CP or evidence of ischemic changes on ECG. - Monitor for CP - Repeating ECG to get a better strip  Leukocytosis Uncertain what to make of this. Based on my chart review, it seems like this has been present for a while. No evidence of infection. Could certainly represent a stress response, but given his history of lymphoma, want to keep a closer eye on this. I worry that the nodule identified on CXR may be a lymph node. - Repeat CBC in am  - CT Chest as above  Multiple open wounds of left lower extremity Patient reports have been present x4 months. Do not appear infected at this time. Had been using silvadene on an outpatient basis  - Will have wound care evaluate for treatment recommendations  Chronic atrial fibrillation (HCC) On Eliquis and low-dose metoprolol at home.  Unclear why he is on such a low-dose of metoprolol. Is on tartrate 12.'5mg'$  once daily (was told to take 6.'25mg'$  BID but the pill cannot be quartered). Per chart review was on up up to '50mg'$  of Succinate daily previously. BP has been on the low-side here so I wonder if this may be part of why he is on such a low dose.  - Continue metoprolol tartrate 12.'5mg'$  daily - Continue Eliquis '5mg'$  BID    FEN/GI: Heart healthy diet VTE Prophylaxis: On Eliquis  Disposition: Med-Tele  History of Present Illness:  Jordan Wells is a 77 y.o. male with a PMH of chronic hypoxic respiratory failure on 4L Home O2, COPD, HTN, diastolic heart failure, CAD s/p PCI, h/o Fournier's gangrene, history of non-Hodgkin's lymphoma s/p splenectomy without chemotherapy or radiation presenting with shortness of breath and dry cough. No fever/chills.   Patient reports symptoms have been present for about 2 weeks now.  He feels like he needs to cough something up but nothing will come up. He lives with his son who helps with meds, but his dad is stubborn and refuses meds frequently. 1-2 weeks without  taking his meds.  Son has mobility impairment and therefore is unable to get him up and around.  Reviewed history with son.  Son reports he is "a stubborn old because."  Son has been trying to encourage him to get up and walk around but patient refuses to do this due to fear for falling. He's relatively inactive/immobile which makes his son worry that he is unable to care for himself. History of being fired from [employer] services due to temper/attitude.   L foot ulceration/blisters.  Patient reports he has been present for about 6 months.  He has been treating this with Silvadene at home with little response. In the ED, chest x-ray showed some bibasilar opacities and EDP was concern for CHF exacerbation so tried a dose of 40 mg of IV Lasix which the patient reports has been symptomatically quite helpful.  Review Of Systems: Per HPI with the following additions:  Review of Systems  Constitutional:  Negative for chills, fever and weight loss.  Respiratory:  Positive for cough, shortness of breath and wheezing. Negative for sputum production.   Cardiovascular:  Negative for chest pain and palpitations.  All other systems reviewed and  are negative.    Pertinent Past Medical History: Non-Hodkins lymphoma s/p splenectomy without chemo or radiation Fournier's Gangrene HFpEF CAD s/p PCI Remainder reviewed in history tab.   Pertinent Past Surgical History: Splenectomy PCI  Remainder reviewed in history tab.   Pertinent Social History: Tobacco use: Yes/No/Former Alcohol use: No Other Substance use: None Lives with Son  Pertinent Family History: Noncontributory   Remainder reviewed in history tab.   Important Outpatient Medications: Eliquis, Metoprolol Remainder reviewed in medication history.   Objective: BP (!) 128/93   Pulse (!) 43   Temp 98.2 F (36.8 C) (Oral)   Resp (!) 22   Ht '5\' 10"'$  (1.778 m)   Wt 123.2 kg   SpO2 93%   BMI 38.97 kg/m  Exam: General: Ill appearing,  tripoding in ED stretcher, O2 cannula off face Eyes: EOMs intact, PERRL ENTM: Mucous membranes moist Neck: Supple, elevated JVP present Cardiovascular: Irregularly irregular, distant heart sounds,  Respiratory: Tripod posture as above, improved once O2 replaced. Intermittently tachypneic. Bibasilar crackles, no wheeze Gastrointestinal: Obese, non-tender, non-distended MSK: 2+ pitting edema to shins bilaterally. S/p amputation of small toe on R foot Derm: Wounds to Left foot as below. No purulence. There is some erythema/shininess but this is present symmetrically on both feet Neuro: Strength and sensation intact throughout Psych: Irritable    Labs:  CBC BMET  Recent Labs  Lab 05/11/22 0754 05/11/22 0839 05/11/22 0841  WBC 17.1*  --   --   HGB 14.4   < > 15.3  HCT 44.5   < > 45.0  PLT 258  --   --    < > = values in this interval not displayed.   Recent Labs  Lab 05/11/22 0754 05/11/22 0839 05/11/22 0841  NA 136 135 134*  K 4.3 4.3 4.4  CL 91* 92*  --   CO2 34*  --   --   BUN 12 17  --   CREATININE 0.73 0.50*  --   GLUCOSE 344* 339*  --   CALCIUM 8.9  --   --     Pertinent additional labs . BNP 299.1. EKG: Afib, rate 103, no ST elevations/depressions, poor tracing with wandering baseline, will repeat EKG    Imaging Studies Performed:  CXR  IMPRESSION: 1. Left greater than right bibasilar opacities with probable small bilateral pleural effusions. 2. 1.5 cm rounded nodular density at the medial aspect of the right lung base.   Eppie Gibson, MD 05/11/2022, 1:09 PM PGY-2, [facility] pager: [phone], text pages welcome Secure chat group [place]

## 2022-05-11 NOTE — Assessment & Plan Note (Addendum)
-  Continue metoprolol tartrate 12.'5mg'$  daily - Dc'd eliquis given comfort care

## 2022-05-11 NOTE — Assessment & Plan Note (Addendum)
Stably elevated - AM CBC

## 2022-05-11 NOTE — Assessment & Plan Note (Addendum)
A1c 12.8. BG 100-120s on Glargine 25u. - Decrease Glargine to 22u daily. - ModSSI

## 2022-05-11 NOTE — ED Notes (Signed)
Dr. Andria Frames paged about elevated blood glucose. Instructed to give maximum ordered dose for meal time coverage and await further orders from the medical team overseeing this patient's care

## 2022-05-11 NOTE — ED Notes (Signed)
Patient was assisted to recliner chair.

## 2022-05-11 NOTE — Assessment & Plan Note (Addendum)
-  Cont wound care

## 2022-05-11 NOTE — Assessment & Plan Note (Addendum)
Limited ability to tolerate CT due to back pain. - prn morphone prior to CT chest wo con

## 2022-05-11 NOTE — ED Notes (Signed)
Patient continues to be uncooperative with keeping monitoring devices on. Patient assisted back to bed. 2 new IV's placed and secured. Patient re-oriented. Safety sitter ordered.

## 2022-05-11 NOTE — ED Notes (Signed)
Patient continuously pulling nasal cannula off face, pulling hospital gown off, and monitoring devices off. Patient re-oriented and re-educated multiple times. Family member at bedside

## 2022-05-11 NOTE — Assessment & Plan Note (Addendum)
UOP 1.8L yesterday on Lasix 80IV. Appears more euvolemic today, lower extremity swelling has improved. Echo with preserved EF, unable to determine diastolic function. - Wean O2 as tolerated (baseline 4L) - Strict I/O's, daily weights - K >4, magnesium >2 - AM BMP, CBC

## 2022-05-11 NOTE — Assessment & Plan Note (Addendum)
Trop downtrended, stopped trending.

## 2022-05-11 NOTE — ED Triage Notes (Signed)
Pt brought in by EMS for SOB/cough; pt has not had his albuterol or metformin in over a month. Pt also reports an ongoing L foot infection that he is on antibiotics for.

## 2022-05-11 NOTE — ED Notes (Signed)
ED TO INPATIENT HANDOFF REPORT  ED Nurse Name and Phone #: Richardson Landry [phone]  S Name/Age/Gender Jordan Wells 77 y.o. male Room/Bed: 039C/039C  Code Status   Code Status: Full Code  Home/SNF/Other Home Patient oriented to: self Is this baseline? No   Triage Complete: Triage complete  Chief Complaint Acute exacerbation of CHF (congestive heart failure) ([place]) [I50.9]  Triage Note Pt brought in by EMS for SOB/cough; pt has not had his albuterol or metformin in over a month. Pt also reports an ongoing L foot infection that he is on antibiotics for.    Allergies No Known Allergies  Level of Care/Admitting Diagnosis ED Disposition     ED Disposition  Admit   Condition  --   Comment  Hospital Area: [facility] [[id number]]  Level of Care: Telemetry Medical [104]  May admit patient to [facility] or [facility] if equivalent level of care is available:: No  Covid Evaluation: Asymptomatic - no recent exposure (last 10 days) testing not required  Diagnosis: Acute exacerbation of CHF (congestive heart failure) [facility]) [[id number]]  Admitting Physician: Eppie Gibson [[id number]]  Attending Physician: Zenia Resides [[id number]]  Certification:: I certify this patient will need inpatient services for at least 2 midnights  Estimated Length of Stay: 3          B Medical/Surgery History Past Medical History:  Diagnosis Date   CHF (congestive heart failure) ([place])    COPD (chronic obstructive pulmonary disease) ([place])    Diabetes mellitus without complication ([place])    High cholesterol    Hypertension    Past Surgical History:  Procedure Laterality Date   BRONCHIAL WASHINGS  02/07/2022   Procedure: BRONCHIAL WASHINGS;  Surgeon: Juanito Doom, MD;  Location: [place];  Service: Cardiopulmonary;;   SPLENECTOMY     VIDEO BRONCHOSCOPY N/A 02/07/2022   Procedure: VIDEO BRONCHOSCOPY WITHOUT FLUORO;  Surgeon: Juanito Doom, MD;  Location: [place];   Service: Cardiopulmonary;  Laterality: N/A;     A IV Location/Drains/Wounds Patient Lines/Drains/Airways Status     Active Line/Drains/Airways     Name Placement date Placement time Site Days   Peripheral IV 05/11/22 20 G Anterior;Right Hand 05/11/22  1521  Hand  less than 1   Peripheral IV 05/11/22 20 G Anterior;Distal;Right;Upper Arm 05/11/22  1521  Arm  less than 1   Pressure Injury 06/01/18 Stage II -  Partial thickness loss of dermis presenting as a shallow open ulcer with a red, pink wound bed without slough. pink, nonblachable, skin broken 06/01/18  0325  -- 1440   Pressure Injury 02/07/22 Buttocks Right;Lower Stage 2 -  Partial thickness loss of dermis presenting as a shallow open injury with a red, pink wound bed without slough. round pink open area 02/07/22  1803  -- 93   Wound / Incision (Open or Dehisced) 06/01/18 Diabetic ulcer Foot Left;Right 06/01/18  0325  Foot  1440   Wound / Incision (Open or Dehisced) 02/08/22 Other (Comment) Toe (Comment  which one) Left 02/08/22  --  Toe (Comment  which one)  92   Wound / Incision (Open or Dehisced) 02/08/22 Foot Right 02/08/22  --  Foot  92   Wound / Incision (Open or Dehisced) 05/11/22 Other (Comment) Foot Left;Anterior 05/11/22  --  Foot  less than 1            Intake/Output Last 24 hours No intake or output data in the 24 hours ending 05/11/22 1521  Labs/Imaging Results  for orders placed or performed during the hospital encounter of 05/11/22 (from the past 48 hour(s))  Brain natriuretic peptide     Status: Abnormal   Collection Time: 05/11/22  7:54 AM  Result Value Ref Range   B Natriuretic Peptide 299.1 (H) 0.0 - 100.0 pg/mL    Comment: Performed at [facility], [address], [place], [place] [address]  CBC with Differential     Status: Abnormal   Collection Time: 05/11/22  7:54 AM  Result Value Ref Range   WBC 17.1 (H) 4.0 - 10.5 K/uL   RBC 4.56 4.22 - 5.81 MIL/uL   Hemoglobin 14.4 13.0 - 17.0 g/dL   HCT  44.5 39.0 - 52.0 %   MCV 97.6 80.0 - 100.0 fL   MCH 31.6 26.0 - 34.0 pg   MCHC 32.4 30.0 - 36.0 g/dL   RDW 13.6 11.5 - 15.5 %   Platelets 258 150 - 400 K/uL   nRBC 0.0 0.0 - 0.2 %   Neutrophils Relative % 78 %   Neutro Abs 13.5 (H) 1.7 - 7.7 K/uL   Lymphocytes Relative 7 %   Lymphs Abs 1.1 0.7 - 4.0 K/uL   Monocytes Relative 12 %   Monocytes Absolute 2.1 (H) 0.1 - 1.0 K/uL   Eosinophils Relative 2 %   Eosinophils Absolute 0.3 0.0 - 0.5 K/uL   Basophils Relative 0 %   Basophils Absolute 0.1 0.0 - 0.1 K/uL   Immature Granulocytes 1 %   Abs Immature Granulocytes 0.08 (H) 0.00 - 0.07 K/uL    Comment: Performed at [facility] [address], [place], [place] [address]  Protime-INR     Status: None   Collection Time: 05/11/22  7:54 AM  Result Value Ref Range   Prothrombin Time 14.1 11.4 - 15.2 seconds   INR 1.1 0.8 - 1.2    Comment: (NOTE) INR goal varies based on device and disease states. Performed at [facility], [place] [address], [place], [place] [address]   Troponin I (High Sensitivity)     Status: Abnormal   Collection Time: 05/11/22  7:54 AM  Result Value Ref Range   Troponin I (High Sensitivity) 49 (H) <18 ng/L    Comment: (NOTE) Elevated high sensitivity troponin I (hsTnI) values and significant  changes across serial measurements may suggest ACS but many other  chronic and acute conditions are known to elevate hsTnI results.  Refer to the "Links" section for chest pain algorithms and additional  guidance. Performed at [facility], [place] [address], [place], [place] [address]   Comprehensive metabolic panel     Status: Abnormal   Collection Time: 05/11/22  7:54 AM  Result Value Ref Range   Sodium 136 135 - 145 mmol/L   Potassium 4.3 3.5 - 5.1 mmol/L   Chloride 91 (L) 98 - 111 mmol/L   CO2 34 (H) 22 - 32 mmol/L   Glucose, Bld 344 (H) 70 - 99 mg/dL    Comment: Glucose reference range applies only to samples taken after fasting for at least 8  hours.   BUN 12 8 - 23 mg/dL   Creatinine, Ser 0.73 0.61 - 1.24 mg/dL   Calcium 8.9 8.9 - 10.3 mg/dL   Total Protein 5.9 (L) 6.5 - 8.1 g/dL   Albumin 3.3 (L) 3.5 - 5.0 g/dL   AST 14 (L) 15 - 41 U/L   ALT 12 0 - 44 U/L   Alkaline Phosphatase 56  38 - 126 U/L   Total Bilirubin 1.1 0.3 - 1.2 mg/dL   GFR, Estimated >60 >60 mL/min    Comment: (NOTE) Calculated using the CKD-EPI Creatinine Equation (2021)    Anion gap 11 5 - 15    Comment: Performed at [facility] [address], [place], [place] [address]  I-stat chem 8, ED     Status: Abnormal   Collection Time: 05/11/22  8:39 AM  Result Value Ref Range   Sodium 135 135 - 145 mmol/L   Potassium 4.3 3.5 - 5.1 mmol/L   Chloride 92 (L) 98 - 111 mmol/L   BUN 17 8 - 23 mg/dL   Creatinine, Ser 0.50 (L) 0.61 - 1.24 mg/dL   Glucose, Bld 339 (H) 70 - 99 mg/dL    Comment: Glucose reference range applies only to samples taken after fasting for at least 8 hours.   Calcium, Ion 1.03 (L) 1.15 - 1.40 mmol/L   TCO2 39 (H) 22 - 32 mmol/L   Hemoglobin 15.3 13.0 - 17.0 g/dL   HCT 45.0 39.0 - 52.0 %  I-Stat venous blood gas, ED     Status: Abnormal   Collection Time: 05/11/22  8:41 AM  Result Value Ref Range   pH, Ven 7.475 (H) 7.25 - 7.43   pCO2, Ven 52.4 44 - 60 mmHg   pO2, Ven 149 (H) 32 - 45 mmHg   Bicarbonate 38.5 (H) 20.0 - 28.0 mmol/L   TCO2 40 (H) 22 - 32 mmol/L   O2 Saturation 99 %   Acid-Base Excess 13.0 (H) 0.0 - 2.0 mmol/L   Sodium 134 (L) 135 - 145 mmol/L   Potassium 4.4 3.5 - 5.1 mmol/L   Calcium, Ion 1.03 (L) 1.15 - 1.40 mmol/L   HCT 45.0 39.0 - 52.0 %   Hemoglobin 15.3 13.0 - 17.0 g/dL   Sample type VENOUS   Urinalysis, Routine w reflex microscopic -Urine, Clean Catch     Status: Abnormal   Collection Time: 05/11/22  9:52 AM  Result Value Ref Range   Color, Urine STRAW (A) YELLOW   APPearance CLEAR CLEAR   Specific Gravity, Urine 1.009 1.005 - 1.030   pH 5.0 5.0 - 8.0   Glucose, UA 150 (A) NEGATIVE mg/dL   Hgb  urine dipstick SMALL (A) NEGATIVE   Bilirubin Urine NEGATIVE NEGATIVE   Ketones, ur NEGATIVE NEGATIVE mg/dL   Protein, ur NEGATIVE NEGATIVE mg/dL   Nitrite NEGATIVE NEGATIVE   Leukocytes,Ua TRACE (A) NEGATIVE   RBC / HPF 0-5 0 - 5 RBC/hpf   WBC, UA 0-5 0 - 5 WBC/hpf   Bacteria, UA RARE (A) NONE SEEN   Squamous Epithelial / HPF 0-5 0 - 5 /HPF    Comment: Performed at [facility], [place] [address], [place], [place] [address]  Troponin I (High Sensitivity)     Status: Abnormal   Collection Time: 05/11/22  9:52 AM  Result Value Ref Range   Troponin I (High Sensitivity) 44 (H) <18 ng/L    Comment: (NOTE) Elevated high sensitivity troponin I (hsTnI) values and significant  changes across serial measurements may suggest ACS but many other  chronic and acute conditions are known to elevate hsTnI results.  Refer to the "Links" section for chest pain algorithms and additional  guidance. Performed at [facility], [place] [address] [place], [place] [address]   Hemoglobin A1c     Status: Abnormal   Collection Time: 05/11/22 11:58 AM  Result  Value Ref Range   Hgb A1c MFr Bld 12.8 (H) 4.8 - 5.6 %    Comment: (NOTE) Pre diabetes:          5.7%-6.4%  Diabetes:              >6.4%  Glycemic control for   <7.0% adults with diabetes    Mean Plasma Glucose 320.66 mg/dL    Comment: Performed at [facility] [address], [place], [place] [address]  Magnesium     Status: None   Collection Time: 05/11/22 11:58 AM  Result Value Ref Range   Magnesium 2.0 1.7 - 2.4 mg/dL    Comment: Performed at [facility] [address], [place], [place] [address]  CBG monitoring, ED     Status: Abnormal   Collection Time: 05/11/22 11:58 AM  Result Value Ref Range   Glucose-Capillary 451 (H) 70 - 99 mg/dL    Comment: Glucose reference range applies only to samples taken after fasting for at least 8 hours.   DG Chest Port 1 View  Result Date: 05/11/2022 CLINICAL DATA:  Shortness of  breath EXAM: PORTABLE CHEST 1 VIEW COMPARISON:  02/07/2022 FINDINGS: Similar enlarged cardiomediastinal contours. Left greater than right bibasilar opacities with probable small bilateral pleural effusions. 1.5 cm rounded nodular density at the medial aspect of the right lung base. No pneumothorax. Chronic deformity of the left second rib. IMPRESSION: 1. Left greater than right bibasilar opacities with probable small bilateral pleural effusions. 2. 1.5 cm rounded nodular density at the medial aspect of the right lung base. Electronically Signed   By: Davina Poke D.O.   On: 05/11/2022 08:34    Pending Labs Unresulted Labs (From admission, onward)     Start     Ordered   05/12/22 8469  Basic metabolic panel  Tomorrow morning,   R        05/11/22 1110   05/12/22 0500  Magnesium  Tomorrow morning,   R        05/11/22 1110   05/11/22 6295  Basic metabolic panel  Once,   R        05/11/22 1140   05/11/22 1303  Culture, blood (Routine X 2) w Reflex to ID Panel  BLOOD CULTURE X 2,   R (with TIMED occurrences)      05/11/22 1303   05/11/22 1109  TSH  Once,   R        05/11/22 1110            Vitals/Pain Today's Vitals   05/11/22 1229 05/11/22 1359 05/11/22 1500 05/11/22 1506  BP:  (!) 138/92 106/76   Pulse:  (!) 104 (!) 31   Resp:   (!) 26   Temp: 98.2 F (36.8 C)   98.1 F (36.7 C)  TempSrc: Oral   Oral  SpO2:   94%   Weight:      Height:      PainSc:        Isolation Precautions No active isolations  Medications Medications  metoprolol tartrate (LOPRESSOR) tablet 12.5 mg (12.5 mg Oral Given 05/11/22 1359)  atorvastatin (LIPITOR) tablet 80 mg (has no administration in time range)  apixaban (ELIQUIS) tablet 5 mg (5 mg Oral Given 05/11/22 1359)  insulin glargine-yfgn (SEMGLEE) injection 25 Units (25 Units Subcutaneous Given 05/11/22 1502)  albuterol (PROVENTIL) (2.5 MG/3ML) 0.083% nebulizer solution 2.5 mg (has no administration in time range)  insulin aspart (novoLOG)  injection 0-15 Units (15  Units Subcutaneous Given 05/11/22 1257)  ketoconazole (NIZORAL) 2 % cream (has no administration in time range)  liver oil-zinc oxide (DESITIN) 40 % ointment (has no administration in time range)  leptospermum manuka honey (MEDIHONEY) paste 1 Application (1 Application Topical Given 05/11/22 1504)  cefTRIAXone (ROCEPHIN) 2 g in sodium chloride 0.9 % 100 mL IVPB (has no administration in time range)  azithromycin (ZITHROMAX) 500 mg in sodium chloride 0.9 % 250 mL IVPB (has no administration in time range)  furosemide (LASIX) injection 40 mg (40 mg Intravenous Given 05/11/22 0755)  furosemide (LASIX) injection 40 mg (40 mg Intravenous Given 05/11/22 1359)    Mobility Can stand and pivot to chair with 2x assist     Focused Assessments Cardiac Assessment Handoff:  Cardiac Rhythm: Atrial fibrillation Lab Results  Component Value Date   CKTOTAL 129 06/01/2018   TROPONINI <0.03 06/01/2018   No results found for: "DDIMER" Does the Patient currently have chest pain? No   , Neuro Assessment Handoff:  Swallow screen pass?  N/a Cardiac Rhythm: Atrial fibrillation       Neuro Assessment: Within Defined Limits Neuro Checks:      Has TPA been given? No If patient is a Neuro Trauma and patient is going to OR before floor call report to [place] nurse: [phone] or [phone]  , Pulmonary Assessment Handoff:  Lung sounds: Bilateral Breath Sounds: Diminished L Breath Sounds: Diminished R Breath Sounds: Diminished O2 Device: Nasal Cannula O2 Flow Rate (L/min): 4 L/min    R Recommendations: See Admitting Provider Note  Report given to:   Additional Notes:

## 2022-05-11 NOTE — Plan of Care (Signed)
  Problem: Coping: Goal: Ability to adjust to condition or change in health will improve Outcome: Not Progressing Pt. Has safety sitter, confused at this time   Problem: Education: Goal: Knowledge of General Education information will improve Description: Including pain rating scale, medication(s)/side effects and non-pharmacologic comfort measures Outcome: Not Progressing Pt. Confused at this time   Problem: Activity: Goal: Risk for activity intolerance will decrease Outcome: Not Progressing  Pt. Unable to complete activities safely at this time  Problem: Safety: Goal: Ability to remain free from injury will improve Outcome: Not Progressing  Pt. Has Air cabin crew

## 2022-05-11 NOTE — Assessment & Plan Note (Addendum)
No wheezing on exam. -Continue Albuterol prn

## 2022-05-11 NOTE — Assessment & Plan Note (Addendum)
-  Maintain clean and dry area -Ketoconazole cream -Zinc oxide

## 2022-05-11 NOTE — ED Provider Notes (Signed)
[facility] Provider Note  CSN: [id number] Arrival date & time: 05/11/22 9242  Chief Complaint(s) Shortness of Breath  HPI Jordan Wells is a 77 y.o. male with history of CHF, COPD, diabetes, chronic hypoxic respiratory failure on 4 L presenting to the emergency department with shortness of breath.  Patient reports shortness of breath for the past week which is worsening.  Patient reports that he has associated dry cough.  He reports fatigue.  No chest pain, productive cough, fevers or chills, body aches, sore throat, runny nose.  No abdominal pain.  He also reports chronic leg swelling which is worsening.  No nausea or vomiting.  He notes that he has a wound on the left foot, currently taking antibiotics.   Past Medical History Past Medical History:  Diagnosis Date   CHF (congestive heart failure) (HCC)    COPD (chronic obstructive pulmonary disease) ([place])    Diabetes mellitus without complication (HCC)    High cholesterol    Hypertension    Patient Active Problem List   Diagnosis Date Noted   Acute exacerbation of CHF (congestive heart failure) ([place]) 05/11/2022   Intertrigo 05/11/2022   Multiple open wounds of left lower extremity 05/11/2022   Pressure injury of skin 02/09/2022   Lung nodule    Chronic atrial fibrillation ([place]) 02/04/2022   Morbid obesity ([place]) 02/04/2022   Acute on chronic respiratory failure with hypoxemia ([place]) 02/04/2022   COPD (chronic obstructive pulmonary disease) ([place]) 10/14/2020   Diabetes mellitus type 2 in obese ([place]) 06/01/2018   CAD (coronary artery disease) 06/01/2018   Other hyperlipidemia 05/08/2018   Home Medication(s) Prior to Admission medications   Medication Sig Start Date End Date Taking? Authorizing Provider  acetaminophen (TYLENOL) 325 MG tablet Take 2 tablets (650 mg total) by mouth every 6 (six) hours as needed for mild pain, fever or headache. 06/03/18  Yes Cherene Altes, MD   apixaban (ELIQUIS) 5 MG TABS tablet Take 5 mg by mouth 2 (two) times daily.   Yes [provider]  atorvastatin (LIPITOR) 80 MG tablet Take 80 mg by mouth at bedtime.   Yes [provider]  furosemide (LASIX) 20 MG tablet Take 20 mg by mouth 2 (two) times daily.   Yes [provider]  gabapentin (NEURONTIN) 600 MG tablet Take 600 mg by mouth at bedtime.   Yes [provider]  potassium chloride SA (KLOR-CON M) 20 MEQ tablet Take 20 mEq by mouth 2 (two) times daily.   Yes [provider]  doxycycline (VIBRA-TABS) 100 MG tablet Take 1 tablet (100 mg total) by mouth every 12 (twelve) hours. 02/10/22   Charlynne Cousins, MD  insulin aspart (NOVOLOG) 100 UNIT/ML FlexPen Inject 5 Units into the skin 3 (three) times daily with meals. 02/10/22   Charlynne Cousins, MD  insulin glargine (LANTUS) 100 UNIT/ML Solostar Pen Inject 50 Units into the skin at bedtime. 02/10/22   Charlynne Cousins, MD  metoprolol tartrate (LOPRESSOR) 25 MG tablet Take 12.5 mg by mouth in the morning.    [provider]  predniSONE (DELTASONE) 10 MG tablet Takes 3 tablets for 1 days, then 2 tabs for 1 days, then 1 tab for 1 days, and then stop. 02/10/22   Charlynne Cousins, MD  silver sulfADIAZINE (SILVADENE) 1 % cream Apply 1 Application topically daily as needed (sores).    [provider]  Past Surgical History Past Surgical History:  Procedure Laterality Date   BRONCHIAL WASHINGS  02/07/2022   Procedure: BRONCHIAL WASHINGS;  Surgeon: Juanito Doom, MD;  Location: [facility];  Service: Cardiopulmonary;;   SPLENECTOMY     VIDEO BRONCHOSCOPY N/A 02/07/2022   Procedure: VIDEO BRONCHOSCOPY WITHOUT FLUORO;  Surgeon: Juanito Doom, MD;  Location: [facility];  Service: Cardiopulmonary;  Laterality: N/A;   Family  History Family History  Problem Relation Age of Onset   Diabetes Mellitus II Neg Hx     Social History Social History   Tobacco Use   Smoking status: Never   Smokeless tobacco: Never  Substance Use Topics   Alcohol use: Never   Drug use: Never   Allergies Patient has no known allergies.  Review of Systems Review of Systems  All other systems reviewed and are negative.   Physical Exam Vital Signs  I have reviewed the triage vital signs BP (!) 128/93   Pulse (!) 43   Temp 97.8 F (36.6 C) (Oral)   Resp (!) 22   Ht '5\' 10"'$  (1.778 m)   Wt 123.2 kg   SpO2 93%   BMI 38.97 kg/m  Physical Exam Vitals and nursing note reviewed.  Constitutional:      General: He is in acute distress.     Appearance: Normal appearance.  HENT:     Mouth/Throat:     Mouth: Mucous membranes are moist.  Eyes:     Conjunctiva/sclera: Conjunctivae normal.  Cardiovascular:     Rate and Rhythm: Normal rate and regular rhythm.  Pulmonary:     Comments: Tachypnea with increased work of breathing, bibasilar crackles bilaterally Abdominal:     General: Abdomen is flat.     Palpations: Abdomen is soft.     Tenderness: There is no abdominal tenderness.  Musculoskeletal:     Right lower leg: Edema present.     Left lower leg: Edema present.  Skin:    General: Skin is warm and dry.     Capillary Refill: Capillary refill takes less than 2 seconds.     Comments: Chronic appearing wound to the dorsum of the left foot with some overlying warmth.  Neurological:     Mental Status: He is alert and oriented to person, place, and time. Mental status is at baseline.  Psychiatric:        Mood and Affect: Mood normal.        Behavior: Behavior normal.     ED Results and Treatments Labs (all labs ordered are listed, but only abnormal results are displayed) Labs Reviewed  BRAIN NATRIURETIC PEPTIDE - Abnormal; Notable for the following components:      Result Value   B Natriuretic Peptide 299.1 (*)     All other components within normal limits  CBC WITH DIFFERENTIAL/PLATELET - Abnormal; Notable for the following components:   WBC 17.1 (*)    Neutro Abs 13.5 (*)    Monocytes Absolute 2.1 (*)    Abs Immature Granulocytes 0.08 (*)    All other components within normal limits  COMPREHENSIVE METABOLIC PANEL - Abnormal; Notable for the following components:   Chloride 91 (*)    CO2 34 (*)    Glucose, Bld 344 (*)    Total Protein 5.9 (*)    Albumin 3.3 (*)    AST 14 (*)    All other components within normal limits  URINALYSIS, ROUTINE W REFLEX MICROSCOPIC - Abnormal; Notable for the following components:   Color,  Urine STRAW (*)    Glucose, UA 150 (*)    Hgb urine dipstick SMALL (*)    Leukocytes,Ua TRACE (*)    Bacteria, UA RARE (*)    All other components within normal limits  I-STAT VENOUS BLOOD GAS, ED - Abnormal; Notable for the following components:   pH, Ven 7.475 (*)    pO2, Ven 149 (*)    Bicarbonate 38.5 (*)    TCO2 40 (*)    Acid-Base Excess 13.0 (*)    Sodium 134 (*)    Calcium, Ion 1.03 (*)    All other components within normal limits  I-STAT CHEM 8, ED - Abnormal; Notable for the following components:   Chloride 92 (*)    Creatinine, Ser 0.50 (*)    Glucose, Bld 339 (*)    Calcium, Ion 1.03 (*)    TCO2 39 (*)    All other components within normal limits  CBG MONITORING, ED - Abnormal; Notable for the following components:   Glucose-Capillary 451 (*)    All other components within normal limits  TROPONIN I (HIGH SENSITIVITY) - Abnormal; Notable for the following components:   Troponin I (High Sensitivity) 49 (*)    All other components within normal limits  TROPONIN I (HIGH SENSITIVITY) - Abnormal; Notable for the following components:   Troponin I (High Sensitivity) 44 (*)    All other components within normal limits  PROTIME-INR  HEMOGLOBIN A1C  TSH  MAGNESIUM  BASIC METABOLIC PANEL                                                                                                                           Radiology DG Chest Port 1 View  Result Date: 05/11/2022 CLINICAL DATA:  Shortness of breath EXAM: PORTABLE CHEST 1 VIEW COMPARISON:  02/07/2022 FINDINGS: Similar enlarged cardiomediastinal contours. Left greater than right bibasilar opacities with probable small bilateral pleural effusions. 1.5 cm rounded nodular density at the medial aspect of the right lung base. No pneumothorax. Chronic deformity of the left second rib. IMPRESSION: 1. Left greater than right bibasilar opacities with probable small bilateral pleural effusions. 2. 1.5 cm rounded nodular density at the medial aspect of the right lung base. Electronically Signed   By: Davina Poke D.O.   On: 05/11/2022 08:34    Pertinent labs & imaging results that were available during my care of the patient were reviewed by me and considered in my medical decision making (see MDM for details).  Medications Ordered in ED Medications  furosemide (LASIX) injection 40 mg (has no administration in time range)  metoprolol tartrate (LOPRESSOR) tablet 12.5 mg (has no administration in time range)  atorvastatin (LIPITOR) tablet 80 mg (has no administration in time range)  apixaban (ELIQUIS) tablet 5 mg (has no administration in time range)  insulin glargine-yfgn (SEMGLEE) injection 25 Units (has no administration in time range)  albuterol (PROVENTIL) (2.5 MG/3ML) 0.083% nebulizer solution 2.5 mg (has no administration in time  range)  insulin aspart (novoLOG) injection 0-15 Units (has no administration in time range)  ketoconazole (NIZORAL) 2 % cream (has no administration in time range)  liver oil-zinc oxide (DESITIN) 40 % ointment (has no administration in time range)  leptospermum manuka honey (MEDIHONEY) paste 1 Application (has no administration in time range)  furosemide (LASIX) injection 40 mg (40 mg Intravenous Given 05/11/22 0755)                                                                                                                                      Procedures .Critical Care  Performed by: Cristie Hem, MD Authorized by: Cristie Hem, MD   Critical care provider statement:    Critical care time (minutes):  30   Critical care was necessary to treat or prevent imminent or life-threatening deterioration of the following conditions:  Cardiac failure   Critical care was time spent personally by me on the following activities:  Development of treatment plan with patient or surrogate, discussions with consultants, evaluation of patient's response to treatment, examination of patient, ordering and review of laboratory studies, ordering and review of radiographic studies, ordering and performing treatments and interventions, pulse oximetry, re-evaluation of patient's condition and review of old charts   (including critical care time)  Medical Decision Making / ED Course   MDM:  77 year old male presenting to the emergency department shortness of breath.  On exam, patient is in mild respiratory distress with bibasilar crackles.  Not hypoxic on 4 to 6 L which patient reportedly uses at baseline.  He also has peripheral edema and a chronic appearing left foot wound with some mild surrounding erythema  Concern for possible CHF exacerbation given bibasilar crackles and peripheral edema.  Will treat with Lasix.  No wheezing to suggest COPD exacerbation.  No clear focal findings to suggest pneumonia, chest x-ray appears similar to x-ray from October 24, was admitted October 23 for atelectasis and had bronchoscopy but does have similar aeration to post procedure.  No chest pain nausea, vomiting, diaphoresis to suggest ACS, EKG without STEMI.  Anticipate patient will likely need to be admitted for further management.  Clinical Course as of 05/11/22 1226  Thu May 11, 2022  0932 Patient feels better. Troponin elevated likely demand related, will trend, no chest pain. On  further history patient does admit to missing multiple lasix doses [WS]  1141 Admitted to family medicine team [WS]    Clinical Course User Index [WS] Cristie Hem, MD     Additional history obtained: -Additional history obtained from ems -External records from outside source obtained and reviewed including: Chart review including previous notes, labs, imaging, consultation notes including admission 02/04/22   Lab Tests: -I ordered, reviewed, and interpreted labs.   The pertinent results include:   Labs Reviewed  BRAIN NATRIURETIC PEPTIDE - Abnormal; Notable for the following components:      Result Value  B Natriuretic Peptide 299.1 (*)    All other components within normal limits  CBC WITH DIFFERENTIAL/PLATELET - Abnormal; Notable for the following components:   WBC 17.1 (*)    Neutro Abs 13.5 (*)    Monocytes Absolute 2.1 (*)    Abs Immature Granulocytes 0.08 (*)    All other components within normal limits  COMPREHENSIVE METABOLIC PANEL - Abnormal; Notable for the following components:   Chloride 91 (*)    CO2 34 (*)    Glucose, Bld 344 (*)    Total Protein 5.9 (*)    Albumin 3.3 (*)    AST 14 (*)    All other components within normal limits  URINALYSIS, ROUTINE W REFLEX MICROSCOPIC - Abnormal; Notable for the following components:   Color, Urine STRAW (*)    Glucose, UA 150 (*)    Hgb urine dipstick SMALL (*)    Leukocytes,Ua TRACE (*)    Bacteria, UA RARE (*)    All other components within normal limits  I-STAT VENOUS BLOOD GAS, ED - Abnormal; Notable for the following components:   pH, Ven 7.475 (*)    pO2, Ven 149 (*)    Bicarbonate 38.5 (*)    TCO2 40 (*)    Acid-Base Excess 13.0 (*)    Sodium 134 (*)    Calcium, Ion 1.03 (*)    All other components within normal limits  I-STAT CHEM 8, ED - Abnormal; Notable for the following components:   Chloride 92 (*)    Creatinine, Ser 0.50 (*)    Glucose, Bld 339 (*)    Calcium, Ion 1.03 (*)    TCO2 39  (*)    All other components within normal limits  CBG MONITORING, ED - Abnormal; Notable for the following components:   Glucose-Capillary 451 (*)    All other components within normal limits  TROPONIN I (HIGH SENSITIVITY) - Abnormal; Notable for the following components:   Troponin I (High Sensitivity) 49 (*)    All other components within normal limits  TROPONIN I (HIGH SENSITIVITY) - Abnormal; Notable for the following components:   Troponin I (High Sensitivity) 44 (*)    All other components within normal limits  PROTIME-INR  HEMOGLOBIN A1C  TSH  MAGNESIUM  BASIC METABOLIC PANEL    Notable for elevated troponin, likely demand, elevated BNP  EKG   EKG Interpretation  Date/Time:  Thursday May 11 2022 07:52:44 EST Ventricular Rate:  103 PR Interval:    QRS Duration: 97 QT Interval:  450 QTC Calculation: 598 R Axis:   112 Text Interpretation: Atrial fibrillation Right axis deviation Low voltage, precordial leads Borderline repol abnormality, diffuse leads Prolonged QT interval Baseline wander in lead(s) I III aVR aVL V1 V6 Confirmed by Garnette Gunner [phone]) on 05/11/2022 8:28:49 AM         Imaging Studies ordered: I ordered imaging studies including CXR On my interpretation imaging demonstrates pulmonary edema I independently visualized and interpreted imaging. I agree with the radiologist interpretation   Medicines ordered and prescription drug management: Meds ordered this encounter  Medications   furosemide (LASIX) injection 40 mg   furosemide (LASIX) injection 40 mg   metoprolol tartrate (LOPRESSOR) tablet 12.5 mg   atorvastatin (LIPITOR) tablet 80 mg   apixaban (ELIQUIS) tablet 5 mg   insulin glargine-yfgn (SEMGLEE) injection 25 Units   albuterol (PROVENTIL) (2.5 MG/3ML) 0.083% nebulizer solution 2.5 mg   insulin aspart (novoLOG) injection 0-15 Units    Order Specific Question:  Correction coverage:    Answer:   Moderate (average weight, post-op)     Order Specific Question:   CBG < 70:    Answer:   Implement Hypoglycemia Standing Orders and refer to Hypoglycemia Standing Orders sidebar report    Order Specific Question:   CBG 70 - 120:    Answer:   0 units    Order Specific Question:   CBG 121 - 150:    Answer:   2 units    Order Specific Question:   CBG 151 - 200:    Answer:   3 units    Order Specific Question:   CBG 201 - 250:    Answer:   5 units    Order Specific Question:   CBG 251 - 300:    Answer:   8 units    Order Specific Question:   CBG 301 - 350:    Answer:   11 units    Order Specific Question:   CBG 351 - 400:    Answer:   15 units    Order Specific Question:   CBG > 400    Answer:   call MD and obtain STAT lab verification   ketoconazole (NIZORAL) 2 % cream   liver oil-zinc oxide (DESITIN) 40 % ointment   leptospermum manuka honey (MEDIHONEY) paste 1 Application    -I have reviewed the patients home medicines and have made adjustments as needed   Consultations Obtained: I requested consultation with the hospitalist,  and discussed lab and imaging findings as well as pertinent plan - they recommend: admission   Cardiac Monitoring: The patient was maintained on a cardiac monitor.  I personally viewed and interpreted the cardiac monitored which showed an underlying rhythm of: NSR  Social Determinants of Health:  Diagnosis or treatment significantly limited by social determinants of health: obesity and lives alone   Reevaluation: After the interventions noted above, I reevaluated the patient and found that they have improved  Co morbidities that complicate the patient evaluation  Past Medical History:  Diagnosis Date   CHF (congestive heart failure) (HCC)    COPD (chronic obstructive pulmonary disease) ([place])    Diabetes mellitus without complication ([place])    High cholesterol    Hypertension       Dispostion: Disposition decision including need for hospitalization was considered, and patient  admitted to the hospital.    Final Clinical Impression(s) / ED Diagnoses Final diagnoses:  Acute congestive heart failure, unspecified heart failure type ([place])     This chart was dictated using voice recognition software.  Despite best efforts to proofread,  errors can occur which can change the documentation meaning.    Cristie Hem, MD 05/11/22 1226

## 2022-05-12 ENCOUNTER — Inpatient Hospital Stay (HOSPITAL_COMMUNITY): Payer: Medicare Other

## 2022-05-12 ENCOUNTER — Other Ambulatory Visit (HOSPITAL_COMMUNITY): Payer: Self-pay

## 2022-05-12 LAB — CBC
HCT: 42.4 % (ref 39.0–52.0)
Hemoglobin: 13.8 g/dL (ref 13.0–17.0)
MCH: 31.5 pg (ref 26.0–34.0)
MCHC: 32.5 g/dL (ref 30.0–36.0)
MCV: 96.8 fL (ref 80.0–100.0)
Platelets: 243 10*3/uL (ref 150–400)
RBC: 4.38 MIL/uL (ref 4.22–5.81)
RDW: 13.8 % (ref 11.5–15.5)
WBC: 14.3 10*3/uL — ABNORMAL HIGH (ref 4.0–10.5)
nRBC: 0 % (ref 0.0–0.2)

## 2022-05-12 LAB — GLUCOSE, CAPILLARY
Glucose-Capillary: 109 mg/dL — ABNORMAL HIGH (ref 70–99)
Glucose-Capillary: 174 mg/dL — ABNORMAL HIGH (ref 70–99)
Glucose-Capillary: 147 mg/dL — ABNORMAL HIGH (ref 70–99)

## 2022-05-12 LAB — BASIC METABOLIC PANEL
Anion gap: 7 (ref 5–15)
BUN: 18 mg/dL (ref 8–23)
CO2: 36 mmol/L — ABNORMAL HIGH (ref 22–32)
Calcium: 8.8 mg/dL — ABNORMAL LOW (ref 8.9–10.3)
Chloride: 95 mmol/L — ABNORMAL LOW (ref 98–111)
Creatinine, Ser: 0.63 mg/dL (ref 0.61–1.24)
GFR, Estimated: >60 mL/min (ref >60)
Glucose, Bld: 112 mg/dL — ABNORMAL HIGH (ref 70–99)
Potassium: 4.1 mmol/L (ref 3.5–5.1)
Sodium: 138 mmol/L (ref 135–145)

## 2022-05-12 LAB — MAGNESIUM: Magnesium: 1.9 mg/dL (ref 1.7–2.4)

## 2022-05-12 MED ORDER — IPRATROPIUM-ALBUTEROL 0.5-2.5 (3) MG/3ML IN SOLN
3.0000 mL | RESPIRATORY_TRACT | Status: DC | PRN
  Administered 2022-05-12: 3 mL via RESPIRATORY_TRACT
  Filled 2022-05-12 (×2): qty 3

## 2022-05-12 MED ORDER — IPRATROPIUM-ALBUTEROL 0.5-2.5 (3) MG/3ML IN SOLN
3.0000 mL | Freq: Four times a day (QID) | RESPIRATORY_TRACT | Status: DC
  Administered 2022-05-13 (×4): 3 mL via RESPIRATORY_TRACT
  Filled 2022-05-12 (×4): qty 3

## 2022-05-12 MED ORDER — ASPIRIN 81 MG PO TBEC
81.0000 mg | DELAYED_RELEASE_TABLET | Freq: Every day | ORAL | Status: DC
  Administered 2022-05-12 – 2022-05-16 (×5): 81 mg via ORAL
  Filled 2022-05-12 (×5): qty 1

## 2022-05-12 MED ORDER — INSULIN GLARGINE-YFGN 100 UNIT/ML ~~LOC~~ SOLN
22.0000 [IU] | Freq: Every day | SUBCUTANEOUS | Status: DC
  Administered 2022-05-13 – 2022-05-15 (×3): 22 [IU] via SUBCUTANEOUS
  Filled 2022-05-12 (×3): qty 0.22

## 2022-05-12 MED ORDER — IPRATROPIUM-ALBUTEROL 0.5-2.5 (3) MG/3ML IN SOLN
3.0000 mL | Freq: Four times a day (QID) | RESPIRATORY_TRACT | Status: DC
  Administered 2022-05-12: 3 mL via RESPIRATORY_TRACT
  Filled 2022-05-12: qty 3

## 2022-05-12 MED ORDER — MORPHINE SULFATE (PF) 2 MG/ML IV SOLN
2.0000 mg | Freq: Once | INTRAVENOUS | Status: AC | PRN
  Administered 2022-05-12: 2 mg via INTRAVENOUS
  Filled 2022-05-12: qty 1

## 2022-05-12 MED ORDER — FUROSEMIDE 10 MG/ML IJ SOLN
80.0000 mg | Freq: Once | INTRAMUSCULAR | Status: AC
  Administered 2022-05-12: 80 mg via INTRAVENOUS
  Filled 2022-05-12: qty 8

## 2022-05-12 MED ORDER — GUAIFENESIN-DM 100-10 MG/5ML PO SYRP
5.0000 mL | ORAL_SOLUTION | ORAL | Status: DC | PRN
  Administered 2022-05-12: 5 mL via ORAL
  Filled 2022-05-12: qty 5

## 2022-05-12 NOTE — Inpatient Diabetes Management (Signed)
Inpatient Diabetes Program Recommendations  AACE/ADA: New Consensus Statement on Inpatient Glycemic Control (2015)  Target Ranges:  Prepandial:   less than 140 mg/dL      Peak postprandial:   less than 180 mg/dL (1-2 hours)      Critically ill patients:  140 - 180 mg/dL    Latest Reference Range & Units 02/05/22 01:42 05/11/22 11:58  Hemoglobin A1C 4.8 - 5.6 % 11.5 (H)  283 mg/dl 12.8 (H)  320 mg/dl  (H): Data is abnormally high  Latest Reference Range & Units 05/11/22 11:58 05/11/22 17:32 05/11/22 21:27 05/12/22 06:14 05/12/22 11:06  Glucose-Capillary 70 - 99 mg/dL 451 (H)  15 units Novolog  182 (H)  3 units Novolog  25 units Semglee '@1502'$  126 (H) 109 (H)    25 units Semglee '@0847'$  174 (H)  (H): Data is abnormally high    Admit with: SOB/ Acute exacerbation of CHF  History: DM2  Home DM Meds: Lantus 50 units QHS       Novolog 5 units TID with meals (NOT taking)  Current Orders: Semglee 22 units Daily      Novolog Moderate Correction Scale/ SSI (0-15 units) TID AC    Patient was hospitalized in October 2023 and was discharged home with Rx to start Novolog 5 units TID with meals--Home Med Rec states pt NOT taking the Novolog despite being prescribed  Was counseled by the Diabetes Coordinator on 02/06/2022--Pt told the Diabetes RN that he was only checking CBGs once weekly--Seemed to have a negative attitude about his diabetes care when he was seen in Oct 2023  Seen by Murvin Donning MD 03/31/2022--Referred to ENDO   Addendum 1pm--Went by to see pt this afternoon.  Per RN, pt just got back from CT scan and received Morphine and was very sleepy.  I called pt's son Trung Wenzl and spoke w/ him.  Son confirmed pt is supposed to be taking Lantus 50 units QHS.  I asked him about the Novolog Rx (5 units TID with meals) that was given to pt last October, and son told me pt is willing to take the Novolog, but since it was not sent to the [place], pt cannot afford the Novolog (too  expensive with his medicare coverage).  Reviewed past and current A1c with pt's son and discussed with son that pt likely needs the Novolog coverage with meals at home given his poor glucose control without it.  Son stated to me that pt likes to eat a lot of "junk" like candy bars and regular sodas and son told me that the pt will yell at him (the son) if he does not go buy the junk food.  Son told me if we want pt to take Novolog at home, to please send the Rx to the [place].  Will alert the MD to this info.  Son did not have any further questions for me at this time.    --Will follow patient during hospitalization--  Wyn Quaker RN, MSN, [facility] Coordinator Inpatient Glycemic Control Team Team Pager: [phone] (8a-5p)

## 2022-05-12 NOTE — Progress Notes (Signed)
Called and updated son Laverna Peace [phone]). - Son prefers to be updated on pt's care

## 2022-05-12 NOTE — NC FL2 (Signed)
[place] LEVEL OF CARE FORM     IDENTIFICATION  Patient Name: Jordan Wells Birthdate: December 16, 1945 Sex: male Admission Date (Current Location): 05/11/2022  [facility] and [place] Number:  Herbalist and Address:  The . [facility], [place] [address], [place], [place] [address]      Provider Number: [id number]  Attending Physician Name and Address:  Zenia Resides, MD  Relative Name and Phone Number:  Jeneen Rinks (son) [phone]    Current Level of Care: Hospital Recommended Level of Care: [place] Prior Approval Number:    Date Approved/Denied:   PASRR Number: [id number]  Discharge Plan: SNF    Current Diagnoses: Patient Active Problem List   Diagnosis Date Noted   Acute exacerbation of CHF (congestive Wells failure) ([place]) 05/11/2022   Intertrigo 05/11/2022   Multiple open wounds of left lower extremity 05/11/2022   Leukocytosis 05/11/2022   Pressure injury of skin 02/09/2022   Lung nodule    Chronic atrial fibrillation ([place]) 02/04/2022   Morbid obesity ([place]) 02/04/2022   Acute on chronic respiratory failure with hypoxemia ([place]) 02/04/2022   COPD (chronic obstructive pulmonary disease) ([place]) 10/14/2020   Type 2 diabetes mellitus with hyperglycemia, with long-term current use of insulin ([place]) 06/01/2018   CAD (coronary artery disease) 06/01/2018   Other hyperlipidemia 05/08/2018    Orientation RESPIRATION BLADDER Height & Weight     Self, Time, Place  O2 (7 Liters) Incontinent, External catheter Weight: 259 lb 14.8 oz (117.9 kg) Height:  '5\' 9"'$  (175.3 cm)  BEHAVIORAL SYMPTOMS/MOOD NEUROLOGICAL BOWEL NUTRITION STATUS      Continent Diet (See dc summary)  AMBULATORY STATUS COMMUNICATION OF NEEDS Skin   Extensive Assist Verbally Normal                       Personal Care Assistance Level of Assistance  Bathing, Feeding, Dressing Bathing Assistance: Maximum assistance Feeding assistance: Limited  assistance Dressing Assistance: Maximum assistance     Functional Limitations Info  Sight, Hearing, Speech Sight Info: Adequate Hearing Info: Adequate Speech Info: Adequate    SPECIAL CARE FACTORS FREQUENCY  PT (By licensed PT), OT (By licensed OT)     PT Frequency: 5xweek OT Frequency: 5xweek            Contractures Contractures Info: Not present    Additional Factors Info  Code Status Code Status Info: Full             Current Medications (05/12/2022):  This is the current hospital active medication list Current Facility-Administered Medications  Medication Dose Route Frequency Provider Last Rate Last Admin   apixaban (ELIQUIS) tablet 5 mg  5 mg Oral BID Eppie Gibson, MD   5 mg at 05/12/22 0846   aspirin EC tablet 81 mg  81 mg Oral Daily [name], MD   81 mg at 05/12/22 1058   atorvastatin (LIPITOR) tablet 80 mg  80 mg Oral QHS Jim Like B, MD   80 mg at 05/11/22 2255   azithromycin (ZITHROMAX) 500 mg in sodium chloride 0.9 % 250 mL IVPB  500 mg Intravenous Q24H Arlyce Dice, MD 250 mL/hr at 05/11/22 1825 500 mg at 05/11/22 1825   cefTRIAXone (ROCEPHIN) 2 g in sodium chloride 0.9 % 100 mL IVPB  2 g Intravenous Q24H Arlyce Dice, MD   Stopped at 05/11/22 1758   insulin aspart (novoLOG) injection 0-15 Units  0-15 Units Subcutaneous TID WC Eppie Gibson, MD  3 Units at 05/12/22 1143   [START ON 05/13/2022] insulin glargine-yfgn (SEMGLEE) injection 22 Units  22 Units Subcutaneous Daily Mahmood, Atif, MD       ipratropium-albuterol (DUONEB) 0.5-2.5 (3) MG/3ML nebulizer solution 3 mL  3 mL Nebulization Q2H PRN Mahmood, Atif, MD       ipratropium-albuterol (DUONEB) 0.5-2.5 (3) MG/3ML nebulizer solution 3 mL  3 mL Nebulization Q6H Hensel, Jamal Collin, MD       ketoconazole (NIZORAL) 2 % cream   Topical BID PRN Eppie Gibson, MD       leptospermum manuka honey (MEDIHONEY) paste 1 Application  1 Application Topical Daily Zenia Resides, MD   1 Application at  40/81/44 0932   liver oil-zinc oxide (DESITIN) 40 % ointment   Topical BID PRN Eppie Gibson, MD       metoprolol tartrate (LOPRESSOR) tablet 12.5 mg  12.5 mg Oral q AM Eppie Gibson, MD   12.5 mg at 05/12/22 0601     Discharge Medications: Please see discharge summary for a list of discharge medications.  Relevant Imaging Results:  Relevant Lab Results:   Additional Information SSN: [id number]  Beckey Rutter, MSW, [place] Transitions of Care  Clinical Social Worker I

## 2022-05-12 NOTE — Progress Notes (Signed)
Physical Therapy Evaluation Patient Details Name: Jordan Wells MRN: [id number] DOB: 20-Dec-1945 Today's Date: 05/12/2022  History of Present Illness  77 yo male presenting 1/25 with respiratory distress, hypoxia, CHF, acute metabolic encephalopathy, mult recent falls. Has chronic L shoulder pain after one of his falls.  Significant L foot wounds, awaiting CT chest to look at lung nodule.  Pt uses 4L O2 at home.   PMHx: CHF, COPD, CAD s/p MI, HLD, DM II, HTN, EF 55-60%, and morbid obesity.  Clinical Impression  Pt was seen for evaluation after being admitted from home with mult falls and generally declining strength and endurance.  Son in attendance who is his caregiver, and is able to supplement information about his condition.  Pt is limited by endurance, with O2 sats dropping to do any moving.  Greatest effort was to sidestep 3 feet on RW, which is for pt easier than scooting up the bed.  Follow acutely for re-instruction of pursed lip breathing, for monitoring of sats and HR, as well as recovery of independence of mobility as tolerated.  Pt is dropping to 85% with mobility on 5L but with pursed lip breathing recovered to 88%.  Follow along for mobility as pt tolerates, with SNF recommended due to depth of deficits, need to give pt time for recovery and to increase standing and gait distances, times, tolerances.      Recommendations for follow up therapy are one component of a multi-disciplinary discharge planning process, led by the attending physician.  Recommendations may be updated based on patient status, additional functional criteria and insurance authorization.  Follow Up Recommendations Skilled nursing-short term rehab (<3 hours/day) Can patient physically be transported by private vehicle: No    Assistance Recommended at Discharge Frequent or constant Supervision/Assistance  Patient can return home with the following  Two people to help with walking and/or transfers;A lot of help with  bathing/dressing/bathroom;Assistance with cooking/housework;Direct supervision/assist for medications management;Direct supervision/assist for financial management;Assist for transportation;Help with stairs or ramp for entrance    Equipment Recommendations None recommended by PT  Recommendations for Other Services       Functional Status Assessment Patient has had a recent decline in their functional status and demonstrates the ability to make significant improvements in function in a reasonable and predictable amount of time.     Precautions / Restrictions Precautions Precautions: Fall Precaution Comments: monitor HR and sats Restrictions Weight Bearing Restrictions: No      Mobility  Bed Mobility Overal bed mobility: Needs Assistance Bed Mobility: Supine to Sit, Sit to Supine     Supine to sit: Mod assist Sit to supine: Max assist   General bed mobility comments: pt is weak and sequencing requires some repetition as pt cannot tolerate lying flat and had to restart return to bed with HOB elevated    Transfers Overall transfer level: Needs assistance Equipment used: Rolling walker (2 wheels) Transfers: Sit to/from Stand Sit to Stand: Mod assist           General transfer comment: mod to power up and worked on verbal and tactile cues for posture    Ambulation/Gait Ambulation/Gait assistance: Herbalist (Feet): 3 Feet Assistive device: Rolling walker (2 wheels), 1 person hand held assist Gait Pattern/deviations: Step-to pattern, Decreased stride length, Wide base of support, Trunk flexed Gait velocity: reduced Gait velocity interpretation: <1.31 ft/sec, indicative of household ambulator Pre-gait activities: standing posture and balance ck General Gait Details: pt is unable to stand fully upright but can flex  over walker to sidestep a couple steps at bedside  Stairs            Wheelchair Mobility    Modified Rankin (Stroke Patients Only)        Balance Overall balance assessment: Needs assistance Sitting-balance support: Feet supported, Bilateral upper extremity supported Sitting balance-Leahy Scale: Fair     Standing balance support: Bilateral upper extremity supported, During functional activity Standing balance-Leahy Scale: Poor                               Pertinent Vitals/Pain      Home Living Family/patient expects to be discharged to:: Skilled nursing facility                   Additional Comments: home in small space per son with RW to go from chair to [facility], has bed but previously slept in recliner    Prior Function Prior Level of Function : Needs assist       Physical Assist : Mobility (physical) Mobility (physical): Transfers;Bed mobility   Mobility Comments: rollator for transition to chair, has SPC and many recent falls       Hand Dominance   Dominant Hand: Right    Extremity/Trunk Assessment   Upper Extremity Assessment Upper Extremity Assessment: Generalized weakness    Lower Extremity Assessment Lower Extremity Assessment: Generalized weakness    Cervical / Trunk Assessment Cervical / Trunk Assessment: Kyphotic  Communication   Communication: No difficulties  Cognition Arousal/Alertness: Awake/alert Behavior During Therapy: WFL for tasks assessed/performed Overall Cognitive Status: Within Functional Limits for tasks assessed                                 General Comments: following instructions for tasks, has low endurance and encephalopathy, very attached to son helping him        General Comments General comments (skin integrity, edema, etc.): Pt is weak but following directions to get up higher in bed, as opposed to scooting on bed, desaturating and requriring titration of O2    Exercises     Assessment/Plan    PT Assessment Patient needs continued PT services  PT Problem List Decreased strength;Decreased range of motion;Decreased  activity tolerance;Decreased balance;Decreased mobility;Decreased coordination;Decreased safety awareness;Cardiopulmonary status limiting activity;Obesity       PT Treatment Interventions DME instruction;Gait training;Functional mobility training;Therapeutic activities;Therapeutic exercise;Balance training;Neuromuscular re-education;Patient/family education    PT Goals (Current goals can be found in the Care Plan section)  Acute Rehab PT Goals Patient Stated Goal: to feel better PT Goal Formulation: With patient/family Time For Goal Achievement: 05/26/22 Potential to Achieve Goals: Good    Frequency Min 3X/week     Co-evaluation               AM-PAC PT "6 Clicks" Mobility  Outcome Measure Help needed turning from your back to your side while in a flat bed without using bedrails?: A Lot Help needed moving from lying on your back to sitting on the side of a flat bed without using bedrails?: A Lot Help needed moving to and from a bed to a chair (including a wheelchair)?: A Lot Help needed standing up from a chair using your arms (e.g., wheelchair or bedside chair)?: A Lot Help needed to walk in hospital room?: A Lot Help needed climbing 3-5 steps with a railing? : Total 6 Click Score:  11    End of Session Equipment Utilized During Treatment: Gait belt;Oxygen Activity Tolerance: Patient limited by fatigue;Treatment limited secondary to medical complications (Comment) Patient left: in bed;with call bell/phone within reach;with bed alarm set;with family/visitor present;with nursing/sitter in room Nurse Communication: Mobility status PT Visit Diagnosis: Unsteadiness on feet (R26.81);Muscle weakness (generalized) (M62.81);History of falling (Z91.81);Difficulty in walking, not elsewhere classified (R26.2);Pain Pain - Right/Left: Left Pain - part of body: Shoulder    Time: 9758-8325 PT Time Calculation (min) (ACUTE ONLY): 20 min   Charges:   PT Evaluation $PT Eval Moderate  Complexity: 1 Mod         Ramond Dial 05/12/2022, 12:54 PM  Mee Hives, PT PhD Acute Rehab Dept. Number: [place] and [place]

## 2022-05-12 NOTE — Progress Notes (Signed)
FMTS Interim Progress Note  S: Assessed patient at bedside with Dr. Larae Grooms. Patient resting comfortably. No respiratory distress. ON chart review patient had HFNC bump from 7L > 9L. RN reported sustained desaturation for one minute after a coughing fit. His saturation was stable on assessment in the room.   O: BP 106/82 (BP Location: Right Arm)   Pulse 93   Temp 98.1 F (36.7 C) (Oral)   Resp (!) 21   Ht '5\' 9"'$  (1.753 m)   Wt 117.9 kg   SpO2 93%   BMI 38.38 kg/m   Chronically ill-appearing, no acute distress Cardio: Regular rate, regular rhythm, no murmurs on exam. Pulm: Coarse breath sounds, no wheezing, mild crackles appreciated in lung bases. No increased work of breathing  A/P: Acute on Chronic CHF exacerbation:  Concern for pulmonary hypertension due to no response to diuresis to Lasix 40 mg BID and second day of Lasix 80 mg BID. His kidney function is stable with no creatinine bump from the lasix. CT chest showed evidence of recurrent aspiration and opacity in the RUL concerning for malignancy. Appears to still be volume overloaded  - order echocardiogram, specifically to assess RVSP  - consider starting a Lasix drip at 8 mL/hr totaling 192 mg/24 hour period  - OR consider starting torsemide  - strict I&Os   Darci Current, DO 05/12/2022, 10:30 PM PGY-1, [place] Service pager [phone]

## 2022-05-12 NOTE — Progress Notes (Signed)
Heart Failure Navigator Progress Note  Assessed for Heart & Vascular TOC clinic readiness.  Patient Jordan Wells 55-60% , progressive mobility issues, foot wounds chronic, No TOC   Navigator available for reassessment of patient.   Earnestine Leys, BSN, Clinical cytogeneticist Only

## 2022-05-12 NOTE — Assessment & Plan Note (Signed)
Per son, pt home health had recommended stopping due to bleed risk but pt did not have any known major bleed or GI bleed. Son concerned that pt leg blisters ooze a lot, but denies any bleeding from the blisters. - Start daily ASA 81

## 2022-05-12 NOTE — Progress Notes (Addendum)
Daily Progress Note Intern Pager: [phone]  Patient name: Jordan Wells Medical record number: [id number] Date of birth: [birth year] Age: 77 y.o. Gender: male  Primary Care Provider: Delorise Shiner, MD Consultants: None Code Status: Full Code  Pt Overview and Major Events to Date:  1/25 Admitted  Assessment and Plan: [name] is a 78yo M admitted for CHF exac and potential CAP.  PMHx pertinent for Non-Hodgkin lymphoma s/p splenectomy, [place] gangrene, HFpEF, CAD s/p PCI, T2DM, Afib on Eliquis, COPD  * Acute exacerbation of CHF (congestive heart failure) ([place]) Now on 5L [place] (baseline 4L). Had minimal response to Lasix 40 IV x2 yesterday (UOP 650cc). Kidney function stable. More alert on exam today. - Lasix 80 IV now. Likely need redose in afternoon depending on response.  - Wean O2 as tolerated (baseline 4L) - Strict I/O's, daily weights - K >4, magnesium >2 - PT/OT - AM BMP, CBC  COPD (chronic obstructive pulmonary disease) (HCC) No wheezing on exam. -Continue Albuterol prn  Type 2 diabetes mellitus with hyperglycemia, with long-term current use of insulin (HCC) A1c 12.8. BG 100-120s on Glargine 25u. - Decrease Glargine to 22u daily. - ModSSI  Intertrigo -Maintain clean and dry area -Ketoconazole cream -Zinc oxide   Lung nodule Limited ability to tolerate CT due to back pain. - prn morphone prior to CT chest wo con  Elevated troponin-resolved as of 05/12/2022 Trop downtrended, stopped trending.  Leukocytosis Stably elevated - CT as above - AM CBC  Multiple open wounds of left lower extremity - Cont wound care  Chronic atrial fibrillation (HCC) - Continue metoprolol tartrate 12.'5mg'$  daily - Continue Eliquis '5mg'$  BID  CAD (coronary artery disease) Per son, pt home health had recommended stopping due to bleed risk but pt did not have any known major bleed or GI bleed. Son concerned that pt leg blisters ooze a lot, but denies any bleeding from the blisters. -  Start daily ASA 81   FEN/GI: heart diet PPx: home Eliquis Dispo:Pending PT recommendations  pending clinical improvement . Barriers include need for IV lasix.   Subjective:  Says breathing is "ok". Appears more alert and responsive today.  Objective: Temp:  [97.4 F (36.3 C)-98.2 F (36.8 C)] 97.7 F (36.5 C) (01/26 0736) Pulse Rate:  [31-104] 92 (01/26 0736) Resp:  [18-26] 20 (01/26 0736) BP: (98-138)/(56-93) 104/70 (01/26 0736) SpO2:  [90 %-97 %] 92 % (01/26 0736) Weight:  [117.9 kg-119.3 kg] 117.9 kg (01/26 0123) Physical Exam: General: More alert and talking this AM. Sitting up in bed. Cardiovascular: RRR Respiratory: CTAB. Normal WOB on 5L [place] Abdomen: Soft, nontender, nondistended. Normal BS. Extremities: Trace edema in BL LE.  Laboratory: Most recent CBC Lab Results  Component Value Date   WBC 14.3 (H) 05/12/2022   HGB 13.8 05/12/2022   HCT 42.4 05/12/2022   MCV 96.8 05/12/2022   PLT 243 05/12/2022   Most recent BMP    Latest Ref Rng & Units 05/12/2022    1:35 AM  BMP  Glucose 70 - 99 mg/dL 112   BUN 8 - 23 mg/dL 18   Creatinine 0.61 - 1.24 mg/dL 0.63   Sodium 135 - 145 mmol/L 138   Potassium 3.5 - 5.1 mmol/L 4.1   Chloride 98 - 111 mmol/L 95   CO2 22 - 32 mmol/L 36   Calcium 8.9 - 10.3 mg/dL 8.8    Arlyce Dice, MD 05/12/2022, 11:39 AM  PGY-1, [place] Intern pager: [phone], text pages welcome  Secure chat group [facility] Teaching Service

## 2022-05-12 NOTE — Progress Notes (Signed)
OT Cancellation Note  Patient Details Name: Jordan Wells MRN: [id number] DOB: [birth year]   Cancelled Treatment:    Reason Eval/Treat Not Completed: Fatigue/lethargy limiting ability to participate Per nursing, pt just got back from CT and lethargic from the morphine given. Will follow up for OT eval tomorrow, 1/27  [name] 05/12/2022, 1:18 PM

## 2022-05-12 NOTE — TOC Progression Note (Signed)
Transition of Care [facility]) - Progression Note    Patient Details  Name: Jordan Wells MRN: [id number] Date of Birth: 1946-03-16  [facility]) CM/SW Contact  Zenon Mayo, RN Phone Number: 05/12/2022, 9:55 AM  Clinical Narrative:    from home with son, CHF, COPD, conts on iv lasix.  TOC following.        Expected Discharge Plan and Services                                               Social Determinants of Health (SDOH) Interventions SDOH Screenings   Tobacco Use: Low Risk  (05/11/2022)    Readmission Risk Interventions     No data to display

## 2022-05-13 ENCOUNTER — Inpatient Hospital Stay (HOSPITAL_COMMUNITY): Payer: Medicare Other

## 2022-05-13 DIAGNOSIS — Z7189 Other specified counseling: Secondary | ICD-10-CM

## 2022-05-13 DIAGNOSIS — R9389 Abnormal findings on diagnostic imaging of other specified body structures: Secondary | ICD-10-CM

## 2022-05-13 DIAGNOSIS — R0609 Other forms of dyspnea: Secondary | ICD-10-CM

## 2022-05-13 DIAGNOSIS — J181 Lobar pneumonia, unspecified organism: Secondary | ICD-10-CM | POA: Insufficient documentation

## 2022-05-13 LAB — BASIC METABOLIC PANEL
Anion gap: 12 (ref 5–15)
BUN: 19 mg/dL (ref 8–23)
CO2: 37 mmol/L — ABNORMAL HIGH (ref 22–32)
Calcium: 8.7 mg/dL — ABNORMAL LOW (ref 8.9–10.3)
Chloride: 90 mmol/L — ABNORMAL LOW (ref 98–111)
Creatinine, Ser: 0.81 mg/dL (ref 0.61–1.24)
GFR, Estimated: >60 mL/min (ref >60)
Glucose, Bld: 166 mg/dL — ABNORMAL HIGH (ref 70–99)
Potassium: 4.2 mmol/L (ref 3.5–5.1)
Sodium: 139 mmol/L (ref 135–145)

## 2022-05-13 LAB — CBC
HCT: 44.4 % (ref 39.0–52.0)
Hemoglobin: 13.7 g/dL (ref 13.0–17.0)
MCH: 30.5 pg (ref 26.0–34.0)
MCHC: 30.9 g/dL (ref 30.0–36.0)
MCV: 98.9 fL (ref 80.0–100.0)
Platelets: 253 10*3/uL (ref 150–400)
RBC: 4.49 MIL/uL (ref 4.22–5.81)
RDW: 13.7 % (ref 11.5–15.5)
WBC: 13 10*3/uL — ABNORMAL HIGH (ref 4.0–10.5)
nRBC: 0 % (ref 0.0–0.2)

## 2022-05-13 LAB — GLUCOSE, CAPILLARY
Glucose-Capillary: 173 mg/dL — ABNORMAL HIGH (ref 70–99)
Glucose-Capillary: 208 mg/dL — ABNORMAL HIGH (ref 70–99)
Glucose-Capillary: 121 mg/dL — ABNORMAL HIGH (ref 70–99)
Glucose-Capillary: 100 mg/dL — ABNORMAL HIGH (ref 70–99)

## 2022-05-13 LAB — ECHOCARDIOGRAM COMPLETE
Height: 69 in
S' Lateral: 4.1 cm
Weight: 4109.37 oz

## 2022-05-13 LAB — MAGNESIUM: Magnesium: 1.8 mg/dL (ref 1.7–2.4)

## 2022-05-13 MED ORDER — SODIUM CHLORIDE 3 % IN NEBU
4.0000 mL | INHALATION_SOLUTION | Freq: Two times a day (BID) | RESPIRATORY_TRACT | Status: AC
  Administered 2022-05-13 – 2022-05-15 (×4): 4 mL via RESPIRATORY_TRACT
  Filled 2022-05-13 (×4): qty 4

## 2022-05-13 MED ORDER — SODIUM CHLORIDE 0.9 % IV SOLN
3.0000 g | Freq: Four times a day (QID) | INTRAVENOUS | Status: DC
  Administered 2022-05-13 – 2022-05-16 (×13): 3 g via INTRAVENOUS
  Filled 2022-05-13 (×15): qty 8

## 2022-05-13 MED ORDER — PERFLUTREN LIPID MICROSPHERE
1.0000 mL | INTRAVENOUS | Status: AC | PRN
  Administered 2022-05-13: 3 mL via INTRAVENOUS

## 2022-05-13 MED ORDER — MAGNESIUM SULFATE IN D5W 1-5 GM/100ML-% IV SOLN
1.0000 g | Freq: Once | INTRAVENOUS | Status: AC
  Administered 2022-05-13: 1 g via INTRAVENOUS
  Filled 2022-05-13: qty 100

## 2022-05-13 MED ORDER — FUROSEMIDE 10 MG/ML IJ SOLN
80.0000 mg | Freq: Once | INTRAMUSCULAR | Status: AC
  Administered 2022-05-13: 80 mg via INTRAVENOUS
  Filled 2022-05-13: qty 8

## 2022-05-13 MED ORDER — IPRATROPIUM-ALBUTEROL 0.5-2.5 (3) MG/3ML IN SOLN
3.0000 mL | Freq: Three times a day (TID) | RESPIRATORY_TRACT | Status: DC
  Administered 2022-05-14 (×2): 3 mL via RESPIRATORY_TRACT
  Filled 2022-05-13 (×3): qty 3

## 2022-05-13 NOTE — Assessment & Plan Note (Addendum)
CT demonstrated occlusion of LLL bronchus and complete opacification of L hemithorax, likely due to atelectasis. Prior admission 01/2022 showed similar collaspe of L lung and required bronch for mucus plug removal.  - appreciate pulm evaluation - Continue Unasyn, IS, DuoNebs

## 2022-05-13 NOTE — Hospital Course (Addendum)
[name] is a 77yo M w/ a hx of Non-Hodgkin lymphoma s/p splenectomy, Fourniers gangrene, HFpEF, CAD s/p PCI, T2DM, Afib on Eliquis, COPD admitted for SOB.   CHF Exacerbation  HFpEF  CAP Pt admitted w/ 2 wk hx of progressive SOB in setting of not taking home medications. BNP elevated and CXR showed pleural eff. Pt was diuresed w/ Lasix and given supplemental O2 as needed (baseline 4L). Echo showed preserved EF, unable to determine diastolic dysfunction.  Improved with IV diuresis. Also received IV Unasyn due to concern for CAP.  R Lung Nodule  L Lung Consolidation  COPD Pt was treated w/ prn Duonebs, but did not suspect COPD exacerbation. CT chest demonstrated consolidation and atelectasis of L lung and unchanged R lung nodule from 01/2022. Pulmonology was consulted and although there was concern for possible malignancy, no further workup was completed due to change in code status and goals of care.  Goals of Care: Hospice, DNR Pt interested in comfort care given poor quality of life. Palliative consulted and patient decided to proceed with home hospice. CODE STATUS: DO NOT RESUSCITATE.  PCP Follow-Up: 1) CT scan showed R lung nodule concerning for malignancy. Did not pursue further workup due to change in code status and decision to pursue hospice 2) Consider stopping insulin based on PO intake 3) Several home meds discontinued due to switch to hospice     IMAGING  ECHOCARDIOGRAM COMPLETE  Result Date: 05/13/2022    ECHOCARDIOGRAM REPORT   Patient Name:   GWYNN CROSSLEY Date of Exam: 05/13/2022 Medical Rec #:  [id number]      Height:       69.0 in Accession #:    [id number]     Weight:       256.8 lb Date of Birth:  1945-11-02      BSA:          2.298 m Patient Age:    89 years       BP:           115/79 mmHg Patient Gender: M              HR:           93 bpm. Exam Location:  Inpatient Procedure: 2D Echo Indications:    dyspnea  History:        Patient has prior history of Echocardiogram  examinations, most                 recent 02/05/2022. CHF, CAD, COPD, Arrythmias:Atrial                 Fibrillation; Risk Factors:Diabetes, Hypertension and                 Dyslipidemia.  Sonographer:    Johny Chess RDCS Referring Phys: Princella Ion A HENSEL  Sonographer Comments: Technically difficult study due to poor echo windows and patient is obese. Image acquisition challenging due to patient body habitus. IMPRESSIONS  1. Technically difficult; apical images difficult; aortic valve calcified with reduced cusp excursion but not well interrogated; likely with some degree of AS; suggest FU limited study when pt in sinus; pt in atrial fibrillation at time of study.  2. Left ventricular ejection fraction, by estimation, is 60 to 65%. The left ventricle has normal function. The left ventricle has no regional wall motion abnormalities. There is mild left ventricular hypertrophy. Left ventricular diastolic function could not be evaluated.  3. Right ventricular systolic function is normal. The right ventricular  size is normal.  4. The mitral valve is normal in structure. Trivial mitral valve regurgitation. No evidence of mitral stenosis. Moderate mitral annular calcification.  5. The aortic valve is tricuspid. Aortic valve regurgitation is not visualized. AS not well interrogated.  6. The inferior vena cava is normal in size with greater than 50% respiratory variability, suggesting right atrial pressure of 3 mmHg. FINDINGS  Left Ventricle: Left ventricular ejection fraction, by estimation, is 60 to 65%. The left ventricle has normal function. The left ventricle has no regional wall motion abnormalities. Definity contrast agent was given IV to delineate the left ventricular  endocardial borders. The left ventricular internal cavity size was normal in size. There is mild left ventricular hypertrophy. Left ventricular diastolic function could not be evaluated due to atrial fibrillation. Left ventricular diastolic  function could not be evaluated. Right Ventricle: The right ventricular size is normal. Right ventricular systolic function is normal. Left Atrium: Left atrial size was normal in size. Right Atrium: Right atrial size was normal in size. Pericardium: There is no evidence of pericardial effusion. Mitral Valve: The mitral valve is normal in structure. Moderate mitral annular calcification. Trivial mitral valve regurgitation. No evidence of mitral valve stenosis. Tricuspid Valve: The tricuspid valve is normal in structure. Tricuspid valve regurgitation is trivial. No evidence of tricuspid stenosis. Aortic Valve: The aortic valve is tricuspid. Aortic valve regurgitation is not visualized. AS not well interrogated. Pulmonic Valve: The pulmonic valve was normal in structure. Pulmonic valve regurgitation is not visualized. No evidence of pulmonic stenosis. Aorta: The aortic root is normal in size and structure. Venous: The inferior vena cava is normal in size with greater than 50% respiratory variability, suggesting right atrial pressure of 3 mmHg. IAS/Shunts: The interatrial septum was not well visualized. Additional Comments: Technically difficult; apical images difficult; aortic valve calcified with reduced cusp excursion but not well interrogated; likely with some degree of AS; suggest FU limited study when pt in sinus; pt in atrial fibrillation at time  of study.  LEFT VENTRICLE PLAX 2D LVIDd:         5.00 cm LVIDs:         4.10 cm LV PW:         1.20 cm LV IVS:        1.30 cm  LEFT ATRIUM         Index       RIGHT ATRIUM           Index LA diam:    3.90 cm 1.70 cm/m  RA Area:     13.40 cm                                 RA Volume:   28.30 ml  12.32 ml/m   AORTA Ao Asc diam: 3.40 cm Kirk Ruths MD Electronically signed by Kirk Ruths MD Signature Date/Time: 05/13/2022/6:08:12 PM    Final    CT CHEST WO CONTRAST  Result Date: 05/12/2022 CLINICAL DATA:  Abnormal x-ray EXAM: CT CHEST WITHOUT CONTRAST  TECHNIQUE: Multidetector CT imaging of the chest was performed following the standard protocol without IV contrast. RADIATION DOSE REDUCTION: This exam was performed according to the departmental dose-optimization program which includes automated exposure control, adjustment of the mA and/or kV according to patient size and/or use of iterative reconstruction technique. COMPARISON:  Chest CT dated February 05, 2022 FINDINGS: Cardiovascular: Cardiomegaly. No pericardial effusion. Severe left main and  three-vessel coronary artery calcifications. Mitral annular calcifications. Normal caliber thoracic aorta with moderate calcified plaque. Mediastinum/Nodes: Esophagus unremarkable. Unchanged enlarged and heterogeneous thyroid. Enlarged right lower paratracheal lymph node seen on series 3, image 47, measuring 1.8 cm in short axis, unchanged in size when compared with prior and likely reactive. Lungs/Pleura: Left lower lobe bronchus is occluded and filled with debris. Complete opacification of the left hemithorax with associated volume loss and air bronchograms, likely due to a combination of atelectasis and airspace consolidation. Irregular solid nodular opacity of the right upper lobe measuring 2.0 cm in mean diameter on series 5 image 34, unchanged size when compared with prior exam. Small right pleural effusion and right basilar atelectasis. Upper Abdomen: Nodular liver contour. Musculoskeletal: No chest wall mass or suspicious bone lesions identified. IMPRESSION: 1. Left lower lobe bronchus is occluded and filled with debris, findings are similar to prior chest CT and likely due to recurrent aspiration. Complete opacification of the left hemithorax, likely due to a combination of atelectasis and airspace consolidation. 2. Irregular solid nodular opacity of the right upper is unchanged in size when compared with prior exam and concerning for primary lung malignancy. Recommend further evaluation with PET-CT. 3. Nodular  liver contour which is suggestive of cirrhosis. 4. Small right pleural effusion and right basilar atelectasis. 5. Aortic Atherosclerosis (ICD10-I70.0). Electronically Signed   By: Yetta Glassman M.D.   On: 05/12/2022 15:29   DG Chest Port 1 View  Result Date: 05/11/2022 CLINICAL DATA:  Shortness of breath EXAM: PORTABLE CHEST 1 VIEW COMPARISON:  02/07/2022 FINDINGS: Similar enlarged cardiomediastinal contours. Left greater than right bibasilar opacities with probable small bilateral pleural effusions. 1.5 cm rounded nodular density at the medial aspect of the right lung base. No pneumothorax. Chronic deformity of the left second rib. IMPRESSION: 1. Left greater than right bibasilar opacities with probable small bilateral pleural effusions. 2. 1.5 cm rounded nodular density at the medial aspect of the right lung base. Electronically Signed   By: Davina Poke D.O.   On: 05/11/2022 08:34

## 2022-05-13 NOTE — Progress Notes (Signed)
  Echocardiogram 2D Echocardiogram has been performed.  Jordan Wells 05/13/2022, 5:56 PM

## 2022-05-13 NOTE — Plan of Care (Signed)
  Problem: Education: Goal: Ability to demonstrate management of disease process will improve Outcome: Progressing Goal: Ability to verbalize understanding of medication therapies will improve Outcome: Progressing   Problem: Activity: Goal: Capacity to carry out activities will improve Outcome: Progressing   Problem: Cardiac: Goal: Ability to achieve and maintain adequate cardiopulmonary perfusion will improve Outcome: Progressing   Problem: Education: Goal: Ability to describe self-care measures that may prevent or decrease complications (Diabetes Survival Skills Education) will improve Outcome: Progressing   Problem: Coping: Goal: Ability to adjust to condition or change in health will improve Outcome: Progressing   Problem: Metabolic: Goal: Ability to maintain appropriate glucose levels will improve Outcome: Progressing   Problem: Skin Integrity: Goal: Risk for impaired skin integrity will decrease Outcome: Progressing   Problem: Tissue Perfusion: Goal: Adequacy of tissue perfusion will improve Outcome: Progressing   Problem: Clinical Measurements: Goal: Diagnostic test results will improve Outcome: Progressing Goal: Respiratory complications will improve Outcome: Progressing

## 2022-05-13 NOTE — Evaluation (Signed)
Occupational Therapy Evaluation Patient Details Name: Jordan Wells MRN: [id number] DOB: 1945-05-07 Today's Date: 05/13/2022   History of Present Illness 77 yo male presenting 1/25 with respiratory distress, hypoxia, CHF, acute metabolic encephalopathy, mult recent falls. Has chronic L shoulder pain after one of his falls.  Significant L foot wounds, awaiting CT chest to look at lung nodule.  Pt uses 4L O2 at home.   PMHx: CHF, COPD, CAD s/p MI, HLD, DM II, HTN, EF 55-60%, and morbid obesity.   Clinical Impression   Pt reports using rollator at baseline for mobility, son assisted pt with ADLs. Per son, pt is in lift chair most of the day (sleeps in lift chair) and only gets up to "pivot" to [facility]. Pt currently needing min-max A for ADLs, mod A for bed mobility, and mod A for transfers with RW. SpO2 with poor wave pleth on 5L O2, increased to 7L O2 and SpO2 in 90's post-transfer. Pt presenting with impairments listed below, will follow acutely. Recommend SNF at d/c.      Recommendations for follow up therapy are one component of a multi-disciplinary discharge planning process, led by the attending physician.  Recommendations may be updated based on patient status, additional functional criteria and insurance authorization.   Follow Up Recommendations  Skilled nursing-short term rehab (<3 hours/day)     Assistance Recommended at Discharge Frequent or constant Supervision/Assistance  Patient can return home with the following A lot of help with walking and/or transfers;A lot of help with bathing/dressing/bathroom;Assistance with cooking/housework;Direct supervision/assist for medications management;Direct supervision/assist for financial management;Help with stairs or ramp for entrance;Assist for transportation    Functional Status Assessment  Patient has had a recent decline in their functional status and demonstrates the ability to make significant improvements in function in a reasonable and  predictable amount of time.  Equipment Recommendations  Other (comment) (defer)    Recommendations for Other Services PT consult     Precautions / Restrictions Precautions Precautions: Fall Precaution Comments: monitor HR and sats Restrictions Weight Bearing Restrictions: No      Mobility Bed Mobility Overal bed mobility: Needs Assistance Bed Mobility: Supine to Sit     Supine to sit: Mod assist     General bed mobility comments: mod A trunk elevation    Transfers Overall transfer level: Needs assistance Equipment used: Rolling walker (2 wheels) Transfers: Sit to/from Stand Sit to Stand: Mod assist                  Balance Overall balance assessment: Needs assistance Sitting-balance support: Feet supported, Bilateral upper extremity supported Sitting balance-Leahy Scale: Fair     Standing balance support: Bilateral upper extremity supported, During functional activity Standing balance-Leahy Scale: Poor                             ADL either performed or assessed with clinical judgement   ADL Overall ADL's : Needs assistance/impaired Eating/Feeding: Minimal assistance   Grooming: Minimal assistance   Upper Body Bathing: Moderate assistance   Lower Body Bathing: Maximal assistance   Upper Body Dressing : Moderate assistance   Lower Body Dressing: Maximal assistance   Toilet Transfer: Moderate assistance;Rolling walker (2 wheels);Stand-pivot;BSC/3in1   Toileting- Water quality scientist and Hygiene: Maximal assistance       Functional mobility during ADLs: Moderate assistance;Rolling walker (2 wheels)       Vision   Vision Assessment?: No apparent visual deficits     Perception  Perception Perception Tested?: No   Praxis Praxis Praxis tested?: Not tested    Pertinent Vitals/Pain       Hand Dominance Right   Extremity/Trunk Assessment Upper Extremity Assessment Upper Extremity Assessment: Generalized weakness   Lower  Extremity Assessment Lower Extremity Assessment: Defer to PT evaluation   Cervical / Trunk Assessment Cervical / Trunk Assessment: Kyphotic   Communication Communication Communication: No difficulties   Cognition Arousal/Alertness: Awake/alert Behavior During Therapy: WFL for tasks assessed/performed Overall Cognitive Status: Impaired/Different from baseline Area of Impairment: Safety/judgement, Following commands, Memory                     Memory: Decreased short-term memory Following Commands: Follows one step commands with increased time Safety/Judgement: Decreased awareness of safety, Decreased awareness of deficits     General Comments: mild impulsivity, cues for safety     General Comments  O2 with poor wave pleth during transfer, SpO2 increased to 7L SpO2 in 90's    Exercises     Shoulder Instructions      Home Living Family/patient expects to be discharged to:: Private residence Living Arrangements: Children (son) Available Help at Discharge: Family;Available 24 hours/day Type of Home: House Home Access: Ramped entrance     Home Layout: Two level;Full bath on main level;Bed/bath upstairs;Able to live on main level with bedroom/bathroom Alternate Level Stairs-Number of Steps: sleeps in his power lift recliner Alternate Level Stairs-Rails: Right Bathroom Shower/Tub: Occupational psychologist: Handicapped height Bathroom Accessibility: Yes   Home Equipment: Grab bars - toilet;Grab bars - tub/shower;Rollator (4 wheels);Cane - single point;Shower seat;Wheelchair - manual;Toilet riser;BSC/3in1;Hand held shower head   Additional Comments: per son, pt only gets out of chair to use BSC (pivot transfer)      Prior Functioning/Environment Prior Level of Function : Needs assist  Cognitive Assist : Mobility (cognitive)           Mobility Comments: rollator for transition to chair, has SPC and many recent falls ADLs Comments: son assisted with  ADLs        OT Problem List: Decreased strength;Decreased range of motion;Decreased activity tolerance;Impaired balance (sitting and/or standing);Decreased cognition;Decreased safety awareness      OT Treatment/Interventions: Self-care/ADL training;Therapeutic exercise;Energy conservation;DME and/or AE instruction;Therapeutic activities;Patient/family education;Balance training    OT Goals(Current goals can be found in the care plan section) Acute Rehab OT Goals Patient Stated Goal: none stated OT Goal Formulation: With patient Time For Goal Achievement: 05/27/22 Potential to Achieve Goals: Good ADL Goals Pt Will Perform Upper Body Dressing: sitting;with supervision Pt Will Perform Lower Body Dressing: with min assist;sitting/lateral leans;sit to/from stand Pt Will Transfer to Toilet: with min assist;squat pivot transfer;stand pivot transfer;bedside commode  OT Frequency: Min 2X/week    Co-evaluation              AM-PAC OT "6 Clicks" Daily Activity     Outcome Measure Help from another person eating meals?: A Little Help from another person taking care of personal grooming?: A Little Help from another person toileting, which includes using toliet, bedpan, or urinal?: A Lot Help from another person bathing (including washing, rinsing, drying)?: A Lot Help from another person to put on and taking off regular upper body clothing?: A Lot Help from another person to put on and taking off regular lower body clothing?: A Lot 6 Click Score: 14   End of Session Equipment Utilized During Treatment: Gait belt;Rolling walker (2 wheels);Oxygen (5-7L) Nurse Communication: Mobility status;Other (comment) (SpO2)  Activity Tolerance: Patient tolerated treatment well Patient left: with call bell/phone within reach;in chair;with family/visitor present  OT Visit Diagnosis: Unsteadiness on feet (R26.81);Other abnormalities of gait and mobility (R26.89);Muscle weakness (generalized) (M62.81)                 Time: 5436-0677 OT Time Calculation (min): 27 min Charges:  OT General Charges $OT Visit: 1 Visit OT Evaluation $OT Eval Moderate Complexity: 1 Mod OT Treatments $Therapeutic Activity: 8-22 mins  Renaye Rakers, OTD, OTR/L SecureChat Preferred Acute Rehab [phone]  Renaye Rakers Koonce 05/13/2022, 12:28 PM

## 2022-05-13 NOTE — Evaluation (Signed)
Clinical/Bedside Swallow Evaluation Patient Details  Name: Jordan Wells MRN: [id number] Date of Birth: Aug 17, 1945  Today's Date: 05/13/2022 Time: SLP Start Time (ACUTE ONLY): 66 SLP Stop Time (ACUTE ONLY): 1430 SLP Time Calculation (min) (ACUTE ONLY): 15 min  Past Medical History:  Past Medical History:  Diagnosis Date   CHF (congestive heart failure) (HCC)    COPD (chronic obstructive pulmonary disease) (HCC)    Diabetes mellitus without complication (HCC)    High cholesterol    Hypertension    Past Surgical History:  Past Surgical History:  Procedure Laterality Date   BRONCHIAL WASHINGS  02/07/2022   Procedure: BRONCHIAL WASHINGS;  Surgeon: Juanito Doom, MD;  Location: [place];  Service: Cardiopulmonary;;   SPLENECTOMY     VIDEO BRONCHOSCOPY N/A 02/07/2022   Procedure: VIDEO BRONCHOSCOPY WITHOUT FLUORO;  Surgeon: Juanito Doom, MD;  Location: [facility];  Service: Cardiopulmonary;  Laterality: N/A;   HPI:  Patient is a 77 y.o. male with PMH: COPD, CAD, a-fib, HTN, HFpEF, DM, non-Hodgkin's lymphoma, CHF, on 4L at home. He presented to the hospital on 05/11/22 with progressive hypoxemia, abnormal chest imaging notable for left-sided volume loss and apparent secretions/debris in left lower lobe bronchus, enlarging right upper lobe pulmonary nodule on CT. Patient has reportedly been chair bound for the last several months with very little movement and per recent MD notes, patient and son are both interested in comfort care options given his overall poor prognosis and discomfort.    Assessment / Plan / Recommendation  Clinical Impression  Patient is not currently presenting with clinical s/s of dysphagia as per this bedside swallow evaluation. He himself denies any current or previous dysphagia and although he does endorse coughing, it does not seem to be from PO's. SLP assessed patient's toleration of thin liquids (water) via consecutive straw sips. Swallow initiation  appeared timely and no overt s/s aspiration or penetration. Patient's voice was hoarse prior to PO's which he reported was his baseline. This bedside swallow evaluation cannot r/o silent aspiration, however SLP not highly suspicious of this. Recommendation is to continue on current diet, no further skilled SLP intervention warranted at this time. SLP Visit Diagnosis: Dysphagia, unspecified (R13.10)    Aspiration Risk  No limitations;Mild aspiration risk    Diet Recommendation Regular;Thin liquid   Liquid Administration via: Cup;Straw Medication Administration: Whole meds with liquid Supervision: Patient able to self feed Compensations: Slow rate;Small sips/bites Postural Changes: Seated upright at 90 degrees    Other  Recommendations Oral Care Recommendations: Oral care BID    Recommendations for follow up therapy are one component of a multi-disciplinary discharge planning process, led by the attending physician.  Recommendations may be updated based on patient status, additional functional criteria and insurance authorization.  Follow up Recommendations No SLP follow up      Assistance Recommended at Discharge    Functional Status Assessment Patient has not had a recent decline in their functional status  Frequency and Duration     N/A       Prognosis   N/A     Swallow Study   General Date of Onset: 05/13/22 HPI: Patient is a 77 y.o. male with PMH: COPD, CAD, a-fib, HTN, HFpEF, DM, non-Hodgkin's lymphoma, CHF, on 4L at home. He presented to the hospital on 05/11/22 with progressive hypoxemia, abnormal chest imaging notable for left-sided volume loss and apparent secretions/debris in left lower lobe bronchus, enlarging right upper lobe pulmonary nodule on CT. Patient has reportedly been chair bound for  the last several months with very little movement and per recent MD notes, patient and son are both interested in comfort care options given his overall poor prognosis and  discomfort. Type of Study: Bedside Swallow Evaluation Previous Swallow Assessment: none found Diet Prior to this Study: Regular;Thin liquids Temperature Spikes Noted: No Respiratory Status: Nasal cannula History of Recent Intubation: No Behavior/Cognition: Alert;Pleasant mood;Confused;Cooperative Oral Cavity Assessment: Within Functional Limits Oral Care Completed by SLP: No Vision: Functional for self-feeding Self-Feeding Abilities: Able to feed self Patient Positioning: Upright in bed Baseline Vocal Quality: Hoarse Volitional Cough: Strong Volitional Swallow: Able to elicit    Oral/Motor/Sensory Function Overall Oral Motor/Sensory Function: Within functional limits   Ice Chips     Thin Liquid Thin Liquid: Within functional limits Presentation: Straw;Self Fed    Nectar Thick     Honey Thick     Puree Puree: Not tested   Solid     Solid: Not tested     Sonia Baller, MA, CCC-SLP Speech Therapy

## 2022-05-13 NOTE — Assessment & Plan Note (Signed)
Pt and son are interested in comfort care options given overall poor prognosis and discomfort. - Palliative consulted, appreciate recs

## 2022-05-13 NOTE — Consult Note (Signed)
NAME:  Jordan Wells, MRN:  [id number], DOB:  09-17-1945, LOS: 2 ADMISSION DATE:  05/11/2022, CONSULTATION DATE:  1/27 REFERRING MD:  Dr Andria Frames FPTS, CHIEF COMPLAINT:  aspiration PNA, L lung atx, RUL nodule   History of Present Illness:  77 year old male with a history of COPD, CAD, A-fib and hypertension with HFpEF, diabetes, non-Hodgkin's lymphoma.  Complicated pulmonary history with a complicated left pleural space and left lower lobe entrapment.  Workup 02/2021 included bronchoscopy, thoracoscopy with biopsies, Pleurx catheter.  No evidence of malignancy at that time.  He is also had COVID-19 09/2020.  He has chronic hypoxemic respiratory failure and is usually on 4 L/min at home.  He is admitted now with progressive hypoxemia.  This in the setting of abnormal chest imaging notable for left-sided volume loss and apparent secretions/debris in the left lower lobe bronchus.  He also has an enlarging right upper lobe pulmonary nodule on CT.  He has been treated with empiric antibiotics, empiric diuresis.  He is on scheduled DuoNeb.  PCCM is consulted regarding his abnormal CT chest, possible role for bronchoscopy, workup of right upper lobe nodule  Pertinent  Medical History   Past Medical History:  Diagnosis Date   CHF (congestive heart failure) (HCC)    COPD (chronic obstructive pulmonary disease) (HCC)    Diabetes mellitus without complication ([place])    High cholesterol    Hypertension     Significant Hospital Events: Including procedures, antibiotic start and stop dates in addition to other pertinent events   CT chest 05/12/2022 reviewed by me, shows almost complete atelectasis/infiltrate of the left hemithorax with some associated air bronchograms.  There appears to be mucus or debris in the left lower lobe bronchus.  Irregular mostly solid right upper lobe pulmonary nodule, 2 cm, enlarged compared with prior done 02/05/2022.  1.8 cm right lower paratracheal node.  Interim History /  Subjective:   States that he is working with flutter valve, I-S but he has not been to clear many secretions.  His breathing is stable.  Objective   Blood pressure 115/79, pulse 88, temperature (!) 97.3 F (36.3 C), temperature source Oral, resp. rate 20, height '5\' 9"'$  (1.753 m), weight 116.5 kg, SpO2 90 %.        Intake/Output Summary (Last 24 hours) at 05/13/2022 1414 Last data filed at 05/13/2022 1300 Gross per 24 hour  Intake 707.29 ml  Output 2400 ml  Net -1692.71 ml   Filed Weights   05/11/22 1628 05/12/22 0123 05/13/22 0148  Weight: 119.3 kg 117.9 kg 116.5 kg    Examination: General: Pleasant elderly gentleman laying in bed in no distress with O2 in place HENT: Oropharynx clear, no upper airway secretions Lungs: Bronchial breath sounds on the left with poor air movement, more clear on the right with few inspiratory crackles Cardiovascular: Regular, distant, no murmur Abdomen: Obese, nondistended with positive bowel sounds Extremities: trace pretibial edema Neuro: Awake, alert, well-oriented, mild dysarthria.  Moves all extremities  Resolved Hospital Problem list     Assessment & Plan:   - Left sided atelectasis +/- pneumonia.  Contributors certainly include component of trapped lung and inability of the left lower lobe to fully expand, impairing mucociliary clearance.  There is a suspected component of chronic aspiration.  He has benefited from bronchoscopy before in October.  Could consider repeating.  Would try aggressive chest physiotherapy, saline nebs, DuoNebs to mobilize secretions first to spare him risk of anesthesia.  This is certainly true in light of  conversations family has had with Dr. Andria Frames regarding his quality of life.  It sounds like palliative care is going to be involved and it may be reasonable to consider avoiding any invasive procedures if family and patient like to go home with hospice.  Agree with antibiotics.  -Enlarging right upper lobe pulmonary  nodule concerning for primary lung malignancy.  If workup were to be desired then would pursue outpatient PET scan and then possibly consider navigational bronchoscopy or other invasive diagnostics to biopsy the pulmonary nodule.  Again in light of the patient's declining quality of life and the consideration of transfer to comfort care it may be very reasonable to defer that, workup.   Labs   CBC: Recent Labs  Lab 05/11/22 0754 05/11/22 0839 05/11/22 0841 05/12/22 0135 05/13/22 0024  WBC 17.1*  --   --  14.3* 13.0*  NEUTROABS 13.5*  --   --   --   --   HGB 14.4 15.3 15.3 13.8 13.7  HCT 44.5 45.0 45.0 42.4 44.4  MCV 97.6  --   --  96.8 98.9  PLT 258  --   --  243 101    Basic Metabolic Panel: Recent Labs  Lab 05/11/22 0754 05/11/22 0839 05/11/22 0841 05/11/22 1158 05/11/22 1634 05/12/22 0135 05/13/22 0024  NA 136 135 134*  --  137 138 139  K 4.3 4.3 4.4  --  4.4 4.1 4.2  CL 91* 92*  --   --  90* 95* 90*  CO2 34*  --   --   --  36* 36* 37*  GLUCOSE 344* 339*  --   --  243* 112* 166*  BUN 12 17  --   --  '16 18 19  '$ CREATININE 0.73 0.50*  --   --  0.94 0.63 0.81  CALCIUM 8.9  --   --   --  9.2 8.8* 8.7*  MG  --   --   --  2.0  --  1.9 1.8   GFR: Estimated Creatinine Clearance: 97.7 mL/min (by C-G formula based on SCr of 0.81 mg/dL). Recent Labs  Lab 05/11/22 0754 05/12/22 0135 05/13/22 0024  WBC 17.1* 14.3* 13.0*    Liver Function Tests: Recent Labs  Lab 05/11/22 0754  AST 14*  ALT 12  ALKPHOS 56  BILITOT 1.1  PROT 5.9*  ALBUMIN 3.3*   No results for input(s): "LIPASE", "AMYLASE" in the last 168 hours. No results for input(s): "AMMONIA" in the last 168 hours.  ABG    Component Value Date/Time   HCO3 38.5 (H) 05/11/2022 0841   TCO2 40 (H) 05/11/2022 0841   O2SAT 99 05/11/2022 0841     Coagulation Profile: Recent Labs  Lab 05/11/22 0754  INR 1.1    Cardiac Enzymes: No results for input(s): "CKTOTAL", "CKMB", "CKMBINDEX", "TROPONINI" in the  last 168 hours.  HbA1C: Hgb A1c MFr Bld  Date/Time Value Ref Range Status  05/11/2022 11:58 AM 12.8 (H) 4.8 - 5.6 % Final    Comment:    (NOTE) Pre diabetes:          5.7%-6.4%  Diabetes:              >6.4%  Glycemic control for   <7.0% adults with diabetes   02/05/2022 01:42 AM 11.5 (H) 4.8 - 5.6 % Final    Comment:    (NOTE) Pre diabetes:          5.7%-6.4%  Diabetes:              >  6.4%  Glycemic control for   <7.0% adults with diabetes     CBG: Recent Labs  Lab 05/12/22 0614 05/12/22 1106 05/12/22 1548 05/13/22 0602 05/13/22 1119  GLUCAP 109* 174* 147* 173* 208*    Review of Systems:   As per HPI  Past Medical History:  He,  has a past medical history of CHF (congestive heart failure) ([place]), COPD (chronic obstructive pulmonary disease) ([place]), Diabetes mellitus without complication ([place]), High cholesterol, and Hypertension.   Surgical History:   Past Surgical History:  Procedure Laterality Date   BRONCHIAL WASHINGS  02/07/2022   Procedure: BRONCHIAL WASHINGS;  Surgeon: Juanito Doom, MD;  Location: [facility];  Service: Cardiopulmonary;;   SPLENECTOMY     VIDEO BRONCHOSCOPY N/A 02/07/2022   Procedure: VIDEO BRONCHOSCOPY WITHOUT FLUORO;  Surgeon: Juanito Doom, MD;  Location: [facility];  Service: Cardiopulmonary;  Laterality: N/A;     Social History:   reports that he has never smoked. He has never used smokeless tobacco. He reports that he does not drink alcohol and does not use drugs.   Family History:  His family history is negative for Diabetes Mellitus II.   Allergies No Known Allergies   Home Medications  Prior to Admission medications   Medication Sig Start Date End Date Taking? Authorizing Provider  acetaminophen (TYLENOL) 325 MG tablet Take 2 tablets (650 mg total) by mouth every 6 (six) hours as needed for mild pain, fever or headache. 06/03/18  Yes Cherene Altes, MD  apixaban (ELIQUIS) 5 MG TABS tablet Take 5 mg by  mouth 2 (two) times daily.   Yes [provider]  atorvastatin (LIPITOR) 80 MG tablet Take 80 mg by mouth at bedtime.   Yes [provider]  furosemide (LASIX) 20 MG tablet Take 20 mg by mouth 2 (two) times daily.   Yes [provider]  gabapentin (NEURONTIN) 600 MG tablet Take 600 mg by mouth at bedtime.   Yes [provider]  insulin glargine (LANTUS) 100 UNIT/ML Solostar Pen Inject 50 Units into the skin at bedtime. 02/10/22  Yes Charlynne Cousins, MD  potassium chloride SA (KLOR-CON M) 20 MEQ tablet Take 20 mEq by mouth 2 (two) times daily.   Yes [provider]  insulin aspart (NOVOLOG) 100 UNIT/ML FlexPen Inject 5 Units into the skin 3 (three) times daily with meals. Patient not taking: Reported on 05/11/2022 02/10/22   Charlynne Cousins, MD  metoprolol tartrate (LOPRESSOR) 25 MG tablet Take 12.5 mg by mouth in the morning. Patient not taking: Reported on 05/11/2022    [provider]     Critical care time: NA     Baltazar Apo, MD, PhD 05/13/2022, 2:14 PM [facility] [phone] or if no answer before 7:00PM call [phone] For any issues after 7:00PM please call eLink [phone]

## 2022-05-13 NOTE — Progress Notes (Signed)
Daily Progress Note Intern Pager: [phone]  Patient name: Jordan Wells Medical record number: [id number] Date of birth: 11/15/1945 Age: 77 y.o. Gender: male  Primary Care Provider: Delorise Shiner, MD Consultants: Pulmonology  Code Status: Full  Pt Overview and Major Events to Date:  1/25 Admitted  Assessment and Plan: Jordan Wells is a 77yo M admitted for CHF exac and potential CAP.   PMHx pertinent for Non-Hodgkin lymphoma s/p splenectomy, Fourniers gangrene, HFpEF, CAD s/p PCI, T2DM, Afib on Eliquis, COPD  * Acute exacerbation of CHF (congestive heart failure) (HCC) Had coughing fit last night and desatted, increased O2 to 9L HFNC, but able to wean back to 5L this AM. UOP 2.3L yesterday on Lasix 80IV BID. Still fluid overloaded on exam, similar to yesterday. - Redose Lasix 80 IV  - f/u echo - Wean O2 as tolerated (baseline 4L) - Strict I/O's, daily weights - K >4, magnesium >2 - AM BMP, CBC  COPD (chronic obstructive pulmonary disease) (HCC) Mild scattered wheezes. - Duonebs prn  Type 2 diabetes mellitus with hyperglycemia, with long-term current use of insulin (HCC) - Cont Glargine to 22u daily. - ModSSI  Intertrigo -Maintain clean and dry area -Ketoconazole cream -Zinc oxide   Lung nodule CT re-demonstrate R upper lung nodule unchanged form prior. C/f malignancy and recommend PET-CT. - Pulmonology consulted, appreciate recs  Elevated troponin-resolved as of 05/12/2022 Trop downtrended, stopped trending.  Goals of care, counseling/discussion Pt and son are interested in comfort care options given overall poor prognosis and discomfort. - Palliative consulted, appreciate recs  Lung consolidation ([place]) CT demonstrated occlusion of LLL bronchus and complete opacification of L hemithorax, likely due to atelectasis. Prior admission 01/2022 showed similar collaspe of L lung and required bronch for mucus plug removal.  - Pulmonology consulted, appreciate  recs  Leukocytosis Stably elevated - AM CBC  Multiple open wounds of left lower extremity - Cont wound care  Chronic atrial fibrillation (HCC) - Continue metoprolol tartrate 12.'5mg'$  daily - Continue Eliquis '5mg'$  BID  CAD (coronary artery disease) - Cont daily ASA 81   FEN/GI: Heart healthy PPx: Cont home eliquis Dispo:Pending PT recommendations  pending clinical improvement . Barriers include new oxygen requirement and need for IV meds.   Subjective:  Reports feeling SOB, about the same as yesterday.  Per Dr. Andria Frames, pt and son are open to discussing comfort care options and interested in Palliative consult.  Objective: Temp:  [98 F (36.7 C)-98.1 F (36.7 C)] 98 F (36.7 C) (01/27 0821) Pulse Rate:  [90-100] 91 (01/27 0837) Resp:  [14-21] 20 (01/27 0821) BP: (106-125)/(69-82) 125/80 (01/27 0837) SpO2:  [88 %-95 %] 90 % (01/27 0821) Weight:  [116.5 kg] 116.5 kg (01/27 0148) Physical Exam: General: Alert, sitting in bed. NAD. Cardiovascular: RRR Respiratory: Decreased breath sounds in L lung fields. Diffuse rhonchi in R lung. Scattered mild end expiratory wheezes. On 5L [place]. Abdomen: Soft, nontender, nondistended. Extremities: 2+ pitting edema BL  Laboratory: Most recent CBC Lab Results  Component Value Date   WBC 13.0 (H) 05/13/2022   HGB 13.7 05/13/2022   HCT 44.4 05/13/2022   MCV 98.9 05/13/2022   PLT 253 05/13/2022   Most recent BMP    Latest Ref Rng & Units 05/13/2022   12:24 AM  BMP  Glucose 70 - 99 mg/dL 166   BUN 8 - 23 mg/dL 19   Creatinine 0.61 - 1.24 mg/dL 0.81   Sodium 135 - 145 mmol/L 139   Potassium 3.5 - 5.1 mmol/L  4.2   Chloride 98 - 111 mmol/L 90   CO2 22 - 32 mmol/L 37   Calcium 8.9 - 10.3 mg/dL 8.7      Imaging/Diagnostic Tests:  CT Chest 1. Left lower lobe bronchus is occluded and filled with debris, findings are similar to prior chest CT and likely due to recurrent aspiration. Complete opacification of the left hemithorax,  likely due to a combination of atelectasis and airspace consolidation. 2. Irregular solid nodular opacity of the right upper is unchanged in size when compared with prior exam and concerning for primary lung malignancy. Recommend further evaluation with PET-CT. 3. Nodular liver contour which is suggestive of cirrhosis. 4. Small right pleural effusion and right basilar atelectasis. 5. Aortic Atherosclerosis (ICD10-I70.0).  Arlyce Dice, MD 05/13/2022, 11:17 AM  PGY-1, [place] Intern pager: [phone], text pages welcome Secure chat group [place]

## 2022-05-14 DIAGNOSIS — J449 Chronic obstructive pulmonary disease, unspecified: Secondary | ICD-10-CM

## 2022-05-14 DIAGNOSIS — Z7189 Other specified counseling: Secondary | ICD-10-CM

## 2022-05-14 DIAGNOSIS — Z515 Encounter for palliative care: Secondary | ICD-10-CM

## 2022-05-14 DIAGNOSIS — J181 Lobar pneumonia, unspecified organism: Secondary | ICD-10-CM

## 2022-05-14 DIAGNOSIS — I5033 Acute on chronic diastolic (congestive) heart failure: Secondary | ICD-10-CM

## 2022-05-14 DIAGNOSIS — R911 Solitary pulmonary nodule: Secondary | ICD-10-CM

## 2022-05-14 DIAGNOSIS — J9621 Acute and chronic respiratory failure with hypoxia: Secondary | ICD-10-CM

## 2022-05-14 DIAGNOSIS — R918 Other nonspecific abnormal finding of lung field: Secondary | ICD-10-CM

## 2022-05-14 LAB — GLUCOSE, CAPILLARY
Glucose-Capillary: 250 mg/dL — ABNORMAL HIGH (ref 70–99)
Glucose-Capillary: 117 mg/dL — ABNORMAL HIGH (ref 70–99)
Glucose-Capillary: 91 mg/dL (ref 70–99)
Glucose-Capillary: 100 mg/dL — ABNORMAL HIGH (ref 70–99)

## 2022-05-14 LAB — BASIC METABOLIC PANEL
Anion gap: 8 (ref 5–15)
BUN: 18 mg/dL (ref 8–23)
CO2: 42 mmol/L — ABNORMAL HIGH (ref 22–32)
Calcium: 8.6 mg/dL — ABNORMAL LOW (ref 8.9–10.3)
Chloride: 90 mmol/L — ABNORMAL LOW (ref 98–111)
Creatinine, Ser: 0.74 mg/dL (ref 0.61–1.24)
GFR, Estimated: >60 mL/min (ref >60)
Glucose, Bld: 87 mg/dL (ref 70–99)
Potassium: 3.6 mmol/L (ref 3.5–5.1)
Sodium: 140 mmol/L (ref 135–145)

## 2022-05-14 LAB — CBC
HCT: 44.3 % (ref 39.0–52.0)
Hemoglobin: 13.7 g/dL (ref 13.0–17.0)
MCH: 30.8 pg (ref 26.0–34.0)
MCHC: 30.9 g/dL (ref 30.0–36.0)
MCV: 99.6 fL (ref 80.0–100.0)
Platelets: 258 10*3/uL (ref 150–400)
RBC: 4.45 MIL/uL (ref 4.22–5.81)
RDW: 13.6 % (ref 11.5–15.5)
WBC: 11.2 10*3/uL — ABNORMAL HIGH (ref 4.0–10.5)
nRBC: 0 % (ref 0.0–0.2)

## 2022-05-14 LAB — MAGNESIUM: Magnesium: 2 mg/dL (ref 1.7–2.4)

## 2022-05-14 MED ORDER — POTASSIUM CHLORIDE 20 MEQ PO PACK
40.0000 meq | PACK | Freq: Once | ORAL | Status: AC
  Administered 2022-05-14: 40 meq via ORAL
  Filled 2022-05-14: qty 2

## 2022-05-14 MED ORDER — LORAZEPAM 0.5 MG PO TABS
0.5000 mg | ORAL_TABLET | Freq: Four times a day (QID) | ORAL | Status: DC | PRN
  Administered 2022-05-15: 1 mg via ORAL
  Filled 2022-05-14: qty 2

## 2022-05-14 MED ORDER — MORPHINE SULFATE (CONCENTRATE) 10 MG/0.5ML PO SOLN
5.0000 mg | ORAL | Status: DC | PRN
  Administered 2022-05-16: 5 mg via SUBLINGUAL
  Filled 2022-05-14: qty 0.5

## 2022-05-14 NOTE — Consult Note (Signed)
Palliative Care Consult Note                                  Date: 05/14/2022   Patient Name: Jordan Wells  DOB: [birth year]  MRN: [id number]  Age / Sex: 77 y.o., male  PCP: Jordan Shiner, MD Referring Physician: Zenia Resides, MD  Reason for Consultation: Establishing goals of care  HPI/Patient Profile: 77 y.o. male  with past medical history of COPD, HFpEF, CAD s/p MI, T2DM, afib on Eliquis, and non-Hodgkin lymphoma s/p splenectomy who presented to the ED on 05/11/2022 with shortness of breath. He was admitted to [facility] Medicine service with acute heart failure exacerbation and possible CAP. He was also found to have R upper lung nodule, with concern for malignancy.   Palliative Medicine has been consulted for goals of care.   Past Medical History:  Diagnosis Date   CHF (congestive heart failure) (HCC)    COPD (chronic obstructive pulmonary disease) (HCC)    Diabetes mellitus without complication (HCC)    High cholesterol    Hypertension     Subjective:   I have reviewed medical records including progress notes, labs and imaging.   I met at bedside with patient and his sons Jordan Wells and Jordan Wells to discuss diagnosis, prognosis, GOC, EOL wishes, disposition, and options.  I introduced Palliative Medicine as specialized medical care for people living with serious illness. It focuses on providing relief from the symptoms and stress of a serious illness.   A brief life review was discussed. Jordan Wells has lived in [place] his entire life. 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 His wife passed away 12 years ago. He used to be an avid Air cabin crew.   Patient lives with his son Jordan Wells, who has been his primary caregiver. There has been a decline in functional status over the past 4-6 weeks. Jordan Wells does not "move much" according to family. He spends most of his time in a recliner. He uses a urinal or bedside commode. He needs help with ADLs. He is on  3-4 L oxygen at home.   We discussed patient's current illness and what it means in the larger context of his ongoing co-morbidities. Current clinical status was reviewed. Natural disease trajectory of chronic illness was discussed, emphasizing that it is progressive and non-curable.  Created space and opportunity for patient and family to express thoughts regarding current medical situation. Values and goals of care were attempted to be elicited.  The difference between full scope medical intervention and comfort care was considered.  I introduced the concept of a comfort path, emphasizing that this path involves de-escalating full scope medical interventions and allowing a natural course to occur. Discussed that the goal is comfort and dignity rather than cure/prolonging life.   Patient is very clear that he wants to be at home and focus on comfort. Family supports this decision. I discussed with patient and family my recommendation for addition of hospice support at home. I explained that hospice would provide personal care for patient and assistance with symptom management and care when he further declines.  Code status was discussed. Recommended DNR/DNI as consistent with a comfort path and as a protective measure to keep [place] from harming the patient in their last moments of life. Patient and family agree.    Review of Systems  Constitutional:  Positive for appetite change.  Respiratory:  Positive for cough.  Objective:   Primary Diagnoses: Present on Admission:  COPD (chronic obstructive pulmonary disease) (HCC)  Chronic atrial fibrillation (HCC)  Lung nodule  CAD (coronary artery disease)   Physical Exam Vitals reviewed.  Constitutional:      General: He is not in acute distress.    Appearance: He is obese. He is ill-appearing.  Pulmonary:     Effort: Pulmonary effort is normal.  Skin:    Comments: Left foot wound with dressing intact  Neurological:     Mental Status:  He is alert and oriented to person, place, and time.     Vital Signs:  BP 123/87   Pulse (!) 102   Temp 98 F (36.7 C) (Oral)   Resp 18   Ht '5\' 9"'$  (1.753 m)   Wt 115 kg   SpO2 92%   BMI 37.44 kg/m   Palliative Assessment/Data: PPS 30-40%     Assessment & Plan:   SUMMARY OF RECOMMENDATIONS   Code status changed to DNR/DNI Goal of care is home with hospice - family preference for [facility] [place] [facility] will continue to follow  Primary Decision Maker: PATIENT with support from his 2 sons  Symptom Management:  Morphine solution 5 mg every 3 hours as needed for pain or dyspnea Ativan 0.5-1 mg every 6 hours as needed for anxiety or sleep  Prognosis:  < 6 months  Discharge Planning:  Home with Hospice   Discussed with: Dr. Dr. Andria Wells, Dr. Joeseph Wells, [facility], and RN (via secure chat)    Thank you for allowing [place] to participate in the care of Jordan Wells  MDM - High   Signed by: Jordan Confer, NP Palliative Medicine Team  Team Phone # [phone]  For individual providers, please see AMION

## 2022-05-14 NOTE — Progress Notes (Signed)
Daily Progress Note Intern Pager: [phone]  Patient name: Jordan Wells Medical record number: [id number] Date of birth: 01-15-1946 Age: 77 y.o. Gender: male  Primary Care Provider: Delorise Shiner, MD Consultants: Jordan Wells Code Status: Full  Pt Overview and Major Events to Date:  1/25 admitted  Assessment and Plan:  Jordan Wells is a 77yo M admitted for CHF exac and potential CAP, noted to have RUL nodule and left lung atelectasis.   PMHx pertinent for Non-Hodgkin lymphoma s/p splenectomy, Fourniers gangrene, HFpEF, CAD s/p PCI, T2DM, Afib on Eliquis, COPD    * Acute exacerbation of CHF (congestive heart failure) (HCC) UOP 1.8L yesterday on Lasix 80IV. Appears more euvolemic today, lower extremity swelling has improved. Echo with preserved EF, unable to determine diastolic function. - Wean O2 as tolerated (baseline 4L) - Strict I/O's, daily weights - K >4, magnesium >2 - AM BMP, CBC  Lung consolidation (HCC) CT demonstrated occlusion of LLL bronchus and complete opacification of L hemithorax, likely due to atelectasis. Prior admission 01/2022 showed similar collaspe of L lung and required bronch for mucus plug removal.  - Pulmonology consulted, appreciate recs - Continue Unasyn, IS, DuoNebs  Lung nodule CT re-demonstrate R upper lung nodule unchanged form prior. C/f malignancy and recommend PET-CT. Pulmonology following, but further workup of this may depend on the outcome of palliative/GOC discussions. -Appreciate pulmonology and palliative recs   COPD (chronic obstructive pulmonary disease) ([place]) Mild scattered wheezes. - Duonebs prn  Type 2 diabetes mellitus with hyperglycemia, with long-term current use of insulin (HCC) - Cont Glargine to 22u daily. - ModSSI  Intertrigo -Maintain clean and dry area -Ketoconazole cream -Zinc oxide   Elevated troponin-resolved as of 05/12/2022 Trop downtrended, stopped trending.  Goals of care, counseling/discussion Pt and son are  interested in comfort care options given overall poor prognosis and discomfort. - Palliative consulted, appreciate recs  Leukocytosis Stably elevated - AM CBC  Multiple open wounds of left lower extremity - Cont wound care  Chronic atrial fibrillation (HCC) - Continue metoprolol tartrate 12.'5mg'$  daily - Continue Eliquis '5mg'$  BID  CAD (coronary artery disease) - Cont daily ASA 81       FEN/GI: Heart healthy PPx: Eliquis Dispo:SNF pending clinical improvement . Barriers include oxygen requirement, GOC discussions.   Subjective:  NAEON. Asking to sit up in bed. States his breathing is not great. States his swelling has improved. Denies other concerns.  Objective: Temp:  [97.3 F (36.3 C)-99.5 F (37.5 C)] 98 F (36.7 C) (01/28 0336) Pulse Rate:  [88-111] 102 (01/28 0845) Resp:  [19-20] 20 (01/28 0336) BP: (98-123)/(70-87) 123/87 (01/28 0845) SpO2:  [90 %-97 %] 95 % (01/28 0845) Weight:  [144 kg] 115 kg (01/28 0336) Physical Exam: General: NAD, sitting up in bed Cardiovascular: Mildly tachycardic, irregularly irregular on monitor Respiratory: Breath sounds present bilaterally, more clear on R; faint crackles, On supplemental O2 Abdomen: NT/ND Extremities: no lower extremity pitting edema  Laboratory: Most recent CBC Lab Results  Component Value Date   WBC 11.2 (H) 05/14/2022   HGB 13.7 05/14/2022   HCT 44.3 05/14/2022   MCV 99.6 05/14/2022   PLT 258 05/14/2022   Most recent BMP    Latest Ref Rng & Units 05/14/2022   12:32 AM  BMP  Glucose 70 - 99 mg/dL 87   BUN 8 - 23 mg/dL 18   Creatinine 0.61 - 1.24 mg/dL 0.74   Sodium 135 - 145 mmol/L 140   Potassium 3.5 - 5.1 mmol/L 3.6  Chloride 98 - 111 mmol/L 90   CO2 22 - 32 mmol/L 42   Calcium 8.9 - 10.3 mg/dL 8.6     Imaging/Diagnostic Tests:  ECHO: IMPRESSIONS   1. Technically difficult; apical images difficult; aortic valve calcified  with reduced cusp excursion but not well interrogated; likely with  some  degree of AS; suggest FU limited study when pt in sinus; pt in atrial  fibrillation at time of study.   2. Left ventricular ejection fraction, by estimation, is 60 to 65%. The  left ventricle has normal function. The left ventricle has no regional  wall motion abnormalities. There is mild left ventricular hypertrophy.  Left ventricular diastolic function  could not be evaluated.   3. Right ventricular systolic function is normal. The right ventricular  size is normal.   4. The mitral valve is normal in structure. Trivial mitral valve  regurgitation. No evidence of mitral stenosis. Moderate mitral annular  calcification.   5. The aortic valve is tricuspid. Aortic valve regurgitation is not  visualized. AS not well interrogated.   6. The inferior vena cava is normal in size with greater than 50%  respiratory variability, suggesting right atrial pressure of 3 mmHg.   Jordan Albino, MD 05/14/2022, 9:02 AM  PGY-1, [place] Intern pager: [phone], text pages welcome Secure chat group Jordan Wells

## 2022-05-14 NOTE — Plan of Care (Signed)
  Problem: Education: Goal: Ability to demonstrate management of disease process will improve Outcome: Not Progressing Goal: Ability to verbalize understanding of medication therapies will improve Outcome: Not Progressing Goal: Individualized Educational Video(s) Outcome: Not Progressing   Problem: Activity: Goal: Capacity to carry out activities will improve Outcome: Not Progressing   Problem: Cardiac: Goal: Ability to achieve and maintain adequate cardiopulmonary perfusion will improve Outcome: Not Progressing   Problem: Education: Goal: Ability to describe self-care measures that may prevent or decrease complications (Diabetes Survival Skills Education) will improve Outcome: Not Progressing Goal: Individualized Educational Video(s) Outcome: Not Progressing   Problem: Coping: Goal: Ability to adjust to condition or change in health will improve Outcome: Not Progressing   Problem: Fluid Volume: Goal: Ability to maintain a balanced intake and output will improve Outcome: Not Progressing   Problem: Health Behavior/Discharge Planning: Goal: Ability to identify and utilize available resources and services will improve Outcome: Not Progressing Goal: Ability to manage health-related needs will improve Outcome: Not Progressing   Problem: Metabolic: Goal: Ability to maintain appropriate glucose levels will improve Outcome: Not Progressing   Problem: Nutritional: Goal: Maintenance of adequate nutrition will improve Outcome: Not Progressing Goal: Progress toward achieving an optimal weight will improve Outcome: Not Progressing   Problem: Skin Integrity: Goal: Risk for impaired skin integrity will decrease Outcome: Not Progressing   Problem: Tissue Perfusion: Goal: Adequacy of tissue perfusion will improve Outcome: Not Progressing   Problem: Education: Goal: Knowledge of General Education information will improve Description: Including pain rating scale,  medication(s)/side effects and non-pharmacologic comfort measures Outcome: Not Progressing   Problem: Health Behavior/Discharge Planning: Goal: Ability to manage health-related needs will improve Outcome: Not Progressing   Problem: Clinical Measurements: Goal: Ability to maintain clinical measurements within normal limits will improve Outcome: Not Progressing Goal: Will remain free from infection Outcome: Not Progressing Goal: Diagnostic test results will improve Outcome: Not Progressing Goal: Respiratory complications will improve Outcome: Not Progressing Goal: Cardiovascular complication will be avoided Outcome: Not Progressing   Problem: Activity: Goal: Risk for activity intolerance will decrease Outcome: Not Progressing   Problem: Nutrition: Goal: Adequate nutrition will be maintained Outcome: Not Progressing   Problem: Coping: Goal: Level of anxiety will decrease Outcome: Not Progressing   Problem: Elimination: Goal: Will not experience complications related to bowel motility Outcome: Not Progressing Goal: Will not experience complications related to urinary retention Outcome: Not Progressing   Problem: Pain Managment: Goal: General experience of comfort will improve Outcome: Not Progressing   Problem: Safety: Goal: Ability to remain free from injury will improve Outcome: Not Progressing   Problem: Skin Integrity: Goal: Risk for impaired skin integrity will decrease Outcome: Not Progressing

## 2022-05-15 ENCOUNTER — Inpatient Hospital Stay (HOSPITAL_COMMUNITY): Payer: Medicare Other

## 2022-05-15 DIAGNOSIS — Z66 Do not resuscitate: Secondary | ICD-10-CM

## 2022-05-15 DIAGNOSIS — I509 Heart failure, unspecified: Secondary | ICD-10-CM

## 2022-05-15 DIAGNOSIS — J189 Pneumonia, unspecified organism: Secondary | ICD-10-CM

## 2022-05-15 LAB — GLUCOSE, CAPILLARY
Glucose-Capillary: 93 mg/dL (ref 70–99)
Glucose-Capillary: 92 mg/dL (ref 70–99)
Glucose-Capillary: 187 mg/dL — ABNORMAL HIGH (ref 70–99)
Glucose-Capillary: 141 mg/dL — ABNORMAL HIGH (ref 70–99)

## 2022-05-15 MED ORDER — IPRATROPIUM-ALBUTEROL 0.5-2.5 (3) MG/3ML IN SOLN
3.0000 mL | Freq: Two times a day (BID) | RESPIRATORY_TRACT | Status: DC
  Administered 2022-05-15 – 2022-05-16 (×3): 3 mL via RESPIRATORY_TRACT
  Filled 2022-05-15 (×3): qty 3

## 2022-05-15 MED ORDER — INSULIN GLARGINE-YFGN 100 UNIT/ML ~~LOC~~ SOLN
15.0000 [IU] | Freq: Every day | SUBCUTANEOUS | Status: DC
  Administered 2022-05-16: 15 [IU] via SUBCUTANEOUS
  Filled 2022-05-15: qty 0.15

## 2022-05-15 NOTE — Progress Notes (Signed)
While walking past patient's room, visibly noted blood on patient's left side of hand, arm, torso, and thigh region. Blood was also present on floor near chair. Upon assessment, noted the PIV was dislodged. Patient care was provided to patient with assistance of staff x 4 and a Denna Haggard lift. Son was bedside. Son stated he wanted patient to get another PIV to complete IV antibiotics. Order placed to [facility] for PIV placement.

## 2022-05-15 NOTE — Care Management Important Message (Signed)
Important Message  Patient Details  Name: Jordan Wells MRN: [id number] Date of Birth: 1945-11-03   Medicare Important Message Given:  Yes     Shelda Altes 05/15/2022, 10:17 AM

## 2022-05-15 NOTE — Plan of Care (Signed)
Patient is resting in bed with eyes closed. No signs of acute distress noted or voiced. Alert and oriented to self only. Assistance provided with ADLs Call bell within reach .     Problem: Nutritional: Goal: Progress toward achieving an optimal weight will improve Outcome: Progressing   Problem: Skin Integrity: Goal: Risk for impaired skin integrity will decrease Outcome: Progressing   Problem: Elimination: Goal: Will not experience complications related to bowel motility Outcome: Progressing Goal: Will not experience complications related to urinary retention Outcome: Progressing   Problem: Pain Managment: Goal: General experience of comfort will improve Outcome: Progressing   Problem: Safety: Goal: Ability to remain free from injury will improve Outcome: Progressing

## 2022-05-15 NOTE — Progress Notes (Signed)
Daily Progress Note Intern Pager: [phone]  Patient name: Jordan Wells Medical record number: [id number] Date of birth: 1945/12/14 Age: 77 y.o. Gender: male  Primary Care Provider: Delorise Shiner, MD Consultants: pulm Code Status: full  Pt Overview and Major Events to Date:  1/25 admitted   Assessment and Plan:  Jordan Wells is a 77yo M admitted for CHF exac and potential CAP, noted to have RUL nodule concerning for malignancy and left lung atelectasis.   After discussions with palliative team, patient and family have elected for primarily comfort measures and dispo to home hospice.   PMHx pertinent for Non-Hodgkin lymphoma s/p splenectomy, [place] gangrene, HFpEF, CAD s/p PCI, T2DM, Afib on Eliquis, COPD   * Acute exacerbation of CHF (congestive heart failure) (HCC) Stable. Euvolemic; lower extremity swelling has improved. Echo with preserved EF, unable to determine diastolic function. - Wean O2 as tolerated (baseline 4L) - K >4, magnesium >2  Goals of care, counseling/discussion Pt and family have decided on home hospice care. Code status DNR/DNI - Palliative consulted, appreciate recs -Moving towards comfort care while inpatient: Dc eliquis, dc cardiac monitoring, dc SSI, vitals qshift  Lung consolidation (HCC) CT demonstrated occlusion of LLL bronchus and complete opacification of L hemithorax, likely due to atelectasis. Prior admission 01/2022 showed similar collaspe of L lung and required bronch for mucus plug removal.  - appreciate pulm evaluation - Continue Unasyn, IS, DuoNebs  Lung nodule CT re-demonstrate R upper lung nodule unchanged form prior. C/f malignancy and recommend PET-CT. Not pursuing further workup due to DNR and comfort care/hospice. -Appreciate pulmonology and palliative recs   COPD (chronic obstructive pulmonary disease) ([place]) Mild scattered wheezes. - Duonebs prn  Type 2 diabetes mellitus with hyperglycemia, with long-term current use of insulin  (HCC) - Glargine 15u daily  Intertrigo -Maintain clean and dry area -Ketoconazole cream -Zinc oxide   Elevated troponin-resolved as of 05/12/2022 Trop downtrended, stopped trending.  Multiple open wounds of left lower extremity - Cont wound care  Chronic atrial fibrillation (HCC) - Continue metoprolol tartrate 12.'5mg'$  daily - Dc'd eliquis given comfort care  CAD (coronary artery disease) - Cont daily ASA 81       FEN/GI: Heart PPx: Eliquis Dispo:home hospice; palliative following; likely dc tomorrow  Subjective:  NAEON, denies concerns this morning. Breathing fine.  Objective: Temp:  [97.9 F (36.6 C)-98.2 F (36.8 C)] 98.2 F (36.8 C) (01/29 0931) Pulse Rate:  [91-110] 103 (01/29 0931) Resp:  [17-20] 18 (01/29 0931) BP: (96-144)/(60-90) 120/84 (01/29 0931) SpO2:  [93 %-97 %] 93 % (01/29 0931) Physical Exam: General: NAD, sitting in chair, eating breakfast Cardiovascular: Irregularly irregular  Respiratory: No respiratory distress. On supplemental O2. Breath sounds present b/l, more clear on R Abdomen: NT/ND Extremities: no significant edema  Laboratory: Most recent CBC Lab Results  Component Value Date   WBC 11.2 (H) 05/14/2022   HGB 13.7 05/14/2022   HCT 44.3 05/14/2022   MCV 99.6 05/14/2022   PLT 258 05/14/2022   Most recent BMP    Latest Ref Rng & Units 05/14/2022   12:32 AM  BMP  Glucose 70 - 99 mg/dL 87   BUN 8 - 23 mg/dL 18   Creatinine 0.61 - 1.24 mg/dL 0.74   Sodium 135 - 145 mmol/L 140   Potassium 3.5 - 5.1 mmol/L 3.6   Chloride 98 - 111 mmol/L 90   CO2 22 - 32 mmol/L 42   Calcium 8.9 - 10.3 mg/dL 8.6      Jordan Wells,  Jordan Score, MD 05/15/2022, 11:50 AM  PGY-1, [place] Intern pager: [phone], text pages welcome Secure chat group [place]

## 2022-05-15 NOTE — Progress Notes (Signed)
   05/15/22 1500  Mobility  Activity Transferred from chair to bed  Level of Assistance Maximum assist, patient does 25-49% (+2)  Assistive Device Front wheel walker  Distance Ambulated (ft) 2 ft  Activity Response Tolerated poorly  Mobility Referral Yes  $Mobility charge 1 Mobility   Mobility Specialist Progress Note  Pt needing to get back in bed. Had c/o shoulder pain. Left in bed w/ all needs met and call bell in reach.   Lucious Groves Mobility Specialist  Please contact via SecureChat or Rehab office at [phone]

## 2022-05-15 NOTE — Progress Notes (Signed)
This chaplain consulted with PMT NP-Julia before responding to the spiritual care consult for creating/updating the Pt. Advance Directive.  The chaplain understands the Pt. goals of care are clear. The PMT is navigating the Pt. choice of hospice support at home. The chaplain understands from PMT both of the Pt. sons agree with the goals of care. The chaplain closed this spiritual care consult.  The chaplain is available for F/U spiritual care as needed.  Chaplain Sallyanne Kuster [phone]

## 2022-05-15 NOTE — Progress Notes (Signed)
Palliative Medicine Progress Note   Patient Name: Jordan Wells       Date: 05/15/2022 DOB: 04-01-1946  Age: 77 y.o. MRN#: [id number] Attending Physician: Zenia Resides, MD Primary Care Physician: Delorise Shiner, MD Admit Date: 05/11/2022    HPI/Patient Profile: 77 y.o. male  with past medical history of COPD, HFpEF, CAD s/p MI, T2DM, afib on Eliquis, and non-Hodgkin lymphoma s/p splenectomy who presented to the ED on 05/11/2022 with shortness of breath. He was admitted to [facility] service with acute heart failure exacerbation and possible CAP. He was also found to have R upper lung nodule, with concern for malignancy.    Palliative Medicine has been consulted for goals of care.   Subjective: Bedside visit. Patient is OOB to the recliner. He wants to go home. He is mildly restless. [name] is at bedside. Questions/concerns addressed. His main concern is needing additional support to help care for his father at home, especially as he is scheduled for hip replacement on 2/28.   I called [facility] [phone]) and spoke with the patient advocate department  to inquire about home care services. They have placed an order for services to assist patient with ADLs at home.    Objective:  Physical Exam Vitals reviewed.  Constitutional:      General: He is awake. He is not in acute distress.    Appearance: He is obese. He is ill-appearing.  Pulmonary:     Effort: Pulmonary effort is normal.  Skin:    Comments: Left foot wound with dressing intact             Palliative Medicine Assessment & Plan   Assessment: Principal Problem:   Acute exacerbation of CHF (congestive heart failure) (HCC) Active Problems:   Type 2 diabetes mellitus with hyperglycemia, with long-term  current use of insulin (HCC)   CAD (coronary artery disease)   Chronic atrial fibrillation (HCC)   COPD (chronic obstructive pulmonary disease) (HCC)   Acute and chronic respiratory failure with hypoxia (HCC)   Lung nodule   Intertrigo   Multiple open wounds of left lower extremity   Lung consolidation (HCC)   Goals of care, counseling/discussion   Pulmonary infiltrate    Recommendations/Plan: Goal of care is home with hospice  PMT will continue to follow Please provide Rx for comfort meds  at discharge   Symptom Management:  Morphine solution 5 mg every 3 hours as needed for pain or dyspnea Ativan 0.5-1 mg every 6 hours as needed for anxiety or sleep  Goals of Care and Additional Recommendations: Limitations on Scope of Treatment: Avoid Hospitalization  Code Status: DNR   Prognosis:  < 6 months   Thank you for allowing the Palliative Medicine Team to assist in the care of this patient.   Lavena Bullion, NP   Please contact Palliative Medicine Team phone at [phone] for questions and concerns.  For individual providers, please see AMION.

## 2022-05-15 NOTE — TOC Progression Note (Signed)
Transition of Care [facility]) - Progression Note    Patient Details  Name: Jordan Wells MRN: [id number] Date of Birth: 1945-09-12  Transition of [facility]) CM/SW Contact  Zenon Mayo, RN Phone Number: 05/15/2022, 10:27 AM  Clinical Narrative:    NCM received consult for home hospice, NCM offered choice to son Jordan Wells who is the legal guardian [phone].  He confirmed he would like  [facility].  NCM made referral  to [facility] for home hospice. Per Laverna Peace patient has home oxygen with [employer] [facility]), lift chair, bariatric BSC, walker and w/chair. Per Laverna Peace he sleeps in a recliner.  Laverna Peace states he would like for patient  to get one more day of iv abx for his LE before he goes home. Will make MD aware.     Barriers to Discharge: Continued Medical Work up  Expected Discharge Plan and Services                         DME Arranged:  (Hospice will supply the DME)         HH Arranged: RN [facility] Agency: [place] Date [employer]: 05/15/22 Time [place]: 70 Representative spoke with at [place]: [place] Determinants of Health ([place]) Interventions SDOH Screenings   Food Insecurity: No Food Insecurity (05/13/2022)  Housing: Low Risk  (05/13/2022)  Transportation Needs: No Transportation Needs (05/13/2022)  Utilities: Not At Risk (05/13/2022)  Tobacco Use: Low Risk  (05/11/2022)    Readmission Risk Interventions     No data to display

## 2022-05-15 NOTE — Progress Notes (Addendum)
When passed by patient room, this student RN noticed blood on patient's left arm, gown, sheets, and floor. PIV was on the floor. RN notified. With additional staff assistance, patient was cleaned, linen and gown was changed. Call bell within reach and patient's son is at the bedside. RN to notify MD to assess if patient needs a new PIV.

## 2022-05-16 LAB — CULTURE, BLOOD (ROUTINE X 2)
Culture: NO GROWTH
Culture: NO GROWTH
Special Requests: ADEQUATE
Special Requests: ADEQUATE

## 2022-05-16 LAB — GLUCOSE, CAPILLARY
Glucose-Capillary: 92 mg/dL (ref 70–99)
Glucose-Capillary: 104 mg/dL — ABNORMAL HIGH (ref 70–99)

## 2022-05-16 MED ORDER — INSULIN GLARGINE 100 UNIT/ML SOLOSTAR PEN: 15.0000 [IU] | PEN_INJECTOR | Freq: Every day | SUBCUTANEOUS | 0 refills | Status: AC

## 2022-05-16 MED ORDER — FUROSEMIDE 20 MG PO TABS: 20.0000 mg | ORAL_TABLET | Freq: Two times a day (BID) | ORAL | 0 refills | Status: AC | PRN

## 2022-05-16 MED ORDER — LORAZEPAM 0.5 MG PO TABS: 0.5000 mg | ORAL_TABLET | Freq: Four times a day (QID) | ORAL | 0 refills | Status: AC | PRN

## 2022-05-16 MED ORDER — MORPHINE SULFATE (CONCENTRATE) 10 MG/0.5ML PO SOLN: 5.0000 mg | ORAL | 0 refills | Status: DC | PRN

## 2022-05-16 NOTE — Discharge Summary (Signed)
[facility] Discharge Summary  Patient name: Jordan Wells Medical record number: [id number] Date of birth: 1945/11/29 Age: 77 y.o. Gender: male Date of Admission: 05/11/2022  Date of Discharge: 05/16/22 Admitting Physician: Eppie Gibson, MD  Primary Care Provider: Delorise Shiner, MD Consultants: palliative, pulmonology  Indication for Hospitalization: acute CHF  Discharge Diagnoses/Problem List:  Principal Problem for Admission: acute CHF Other Problems addressed during stay:  Principal Problem:   Acute exacerbation of CHF (congestive heart failure) ([place]) Active Problems:   Lung nodule   Lung consolidation ([place])   Goals of care, counseling/discussion   COPD (chronic obstructive pulmonary disease) ([place])   Type 2 diabetes mellitus with hyperglycemia, with long-term current use of insulin ([place])   Intertrigo   Pneumonia due to infectious organism   CAD (coronary artery disease)   Chronic atrial fibrillation ([place])   Acute and chronic respiratory failure with hypoxia ([employer])   Multiple open wounds of left lower extremity   Pulmonary infiltrate    Brief Hospital Course:  Jordan Wells is a 77yo M w/ a hx of Non-Hodgkin lymphoma s/p splenectomy, [place] gangrene, HFpEF, CAD s/p PCI, T2DM, Afib on Eliquis, COPD admitted for SOB.   CHF Exacerbation  HFpEF  CAP Pt admitted w/ 2 wk hx of progressive SOB in setting of not taking home medications. BNP elevated and CXR showed pleural eff. Pt was diuresed w/ Lasix and given supplemental O2 as needed (baseline 4L). Echo showed preserved EF, unable to determine diastolic dysfunction.  Improved with IV diuresis. Also received IV Unasyn due to concern for CAP.  R Lung Nodule  L Lung Consolidation  COPD Pt was treated w/ prn Duonebs, but did not suspect COPD exacerbation. CT chest demonstrated consolidation and atelectasis of L lung and unchanged R lung nodule from 01/2022. Pulmonology was consulted and although there  was concern for possible malignancy, no further workup was completed due to change in code status and goals of care.  Goals of Care: Hospice, DNR Pt interested in comfort care given poor quality of life. Palliative consulted and patient decided to proceed with home hospice. CODE STATUS: DO NOT RESUSCITATE.  PCP Follow-Up: 1) CT scan showed R lung nodule concerning for malignancy. Did not pursue further workup due to change in code status and decision to pursue hospice 2) Consider stopping insulin based on PO intake 3) Several home meds discontinued due to switch to hospice     IMAGING  ECHOCARDIOGRAM COMPLETE  Result Date: 05/13/2022    ECHOCARDIOGRAM REPORT   Patient Name:   Jordan Wells Date of Exam: 05/13/2022 Medical Rec #:  [id number]      Height:       69.0 in Accession #:    [id number]     Weight:       256.8 lb Date of Birth:  1946/01/27      BSA:          2.298 m Patient Age:    65 years       BP:           115/79 mmHg Patient Gender: M              HR:           93 bpm. Exam Location:  Inpatient Procedure: 2D Echo Indications:    dyspnea  History:        Patient has prior history of Echocardiogram examinations, most  recent 02/05/2022. CHF, CAD, COPD, Arrythmias:Atrial                 Fibrillation; Risk Factors:Diabetes, Hypertension and                 Dyslipidemia.  Sonographer:    Johny Chess RDCS Referring Phys: Princella Ion A HENSEL  Sonographer Comments: Technically difficult study due to poor echo windows and patient is obese. Image acquisition challenging due to patient body habitus. IMPRESSIONS  1. Technically difficult; apical images difficult; aortic valve calcified with reduced cusp excursion but not well interrogated; likely with some degree of AS; suggest FU limited study when pt in sinus; pt in atrial fibrillation at time of study.  2. Left ventricular ejection fraction, by estimation, is 60 to 65%. The left ventricle has normal function. The left  ventricle has no regional wall motion abnormalities. There is mild left ventricular hypertrophy. Left ventricular diastolic function could not be evaluated.  3. Right ventricular systolic function is normal. The right ventricular size is normal.  4. The mitral valve is normal in structure. Trivial mitral valve regurgitation. No evidence of mitral stenosis. Moderate mitral annular calcification.  5. The aortic valve is tricuspid. Aortic valve regurgitation is not visualized. AS not well interrogated.  6. The inferior vena cava is normal in size with greater than 50% respiratory variability, suggesting right atrial pressure of 3 mmHg. FINDINGS  Left Ventricle: Left ventricular ejection fraction, by estimation, is 60 to 65%. The left ventricle has normal function. The left ventricle has no regional wall motion abnormalities. Definity contrast agent was given IV to delineate the left ventricular  endocardial borders. The left ventricular internal cavity size was normal in size. There is mild left ventricular hypertrophy. Left ventricular diastolic function could not be evaluated due to atrial fibrillation. Left ventricular diastolic function could not be evaluated. Right Ventricle: The right ventricular size is normal. Right ventricular systolic function is normal. Left Atrium: Left atrial size was normal in size. Right Atrium: Right atrial size was normal in size. Pericardium: There is no evidence of pericardial effusion. Mitral Valve: The mitral valve is normal in structure. Moderate mitral annular calcification. Trivial mitral valve regurgitation. No evidence of mitral valve stenosis. Tricuspid Valve: The tricuspid valve is normal in structure. Tricuspid valve regurgitation is trivial. No evidence of tricuspid stenosis. Aortic Valve: The aortic valve is tricuspid. Aortic valve regurgitation is not visualized. AS not well interrogated. Pulmonic Valve: The pulmonic valve was normal in structure. Pulmonic valve  regurgitation is not visualized. No evidence of pulmonic stenosis. Aorta: The aortic root is normal in size and structure. Venous: The inferior vena cava is normal in size with greater than 50% respiratory variability, suggesting right atrial pressure of 3 mmHg. IAS/Shunts: The interatrial septum was not well visualized. Additional Comments: Technically difficult; apical images difficult; aortic valve calcified with reduced cusp excursion but not well interrogated; likely with some degree of AS; suggest FU limited study when pt in sinus; pt in atrial fibrillation at time  of study.  LEFT VENTRICLE PLAX 2D LVIDd:         5.00 cm LVIDs:         4.10 cm LV PW:         1.20 cm LV IVS:        1.30 cm  LEFT ATRIUM         Index       RIGHT ATRIUM  Index LA diam:    3.90 cm 1.70 cm/m  RA Area:     13.40 cm                                 RA Volume:   28.30 ml  12.32 ml/m   AORTA Ao Asc diam: 3.40 cm Kirk Ruths MD Electronically signed by Kirk Ruths MD Signature Date/Time: 05/13/2022/6:08:12 PM    Final    CT CHEST WO CONTRAST  Result Date: 05/12/2022 CLINICAL DATA:  Abnormal x-ray EXAM: CT CHEST WITHOUT CONTRAST TECHNIQUE: Multidetector CT imaging of the chest was performed following the standard protocol without IV contrast. RADIATION DOSE REDUCTION: This exam was performed according to the departmental dose-optimization program which includes automated exposure control, adjustment of the mA and/or kV according to patient size and/or use of iterative reconstruction technique. COMPARISON:  Chest CT dated February 05, 2022 FINDINGS: Cardiovascular: Cardiomegaly. No pericardial effusion. Severe left main and three-vessel coronary artery calcifications. Mitral annular calcifications. Normal caliber thoracic aorta with moderate calcified plaque. Mediastinum/Nodes: Esophagus unremarkable. Unchanged enlarged and heterogeneous thyroid. Enlarged right lower paratracheal lymph node seen on series 3, image 47,  measuring 1.8 cm in short axis, unchanged in size when compared with prior and likely reactive. Lungs/Pleura: Left lower lobe bronchus is occluded and filled with debris. Complete opacification of the left hemithorax with associated volume loss and air bronchograms, likely due to a combination of atelectasis and airspace consolidation. Irregular solid nodular opacity of the right upper lobe measuring 2.0 cm in mean diameter on series 5 image 34, unchanged size when compared with prior exam. Small right pleural effusion and right basilar atelectasis. Upper Abdomen: Nodular liver contour. Musculoskeletal: No chest wall mass or suspicious bone lesions identified. IMPRESSION: 1. Left lower lobe bronchus is occluded and filled with debris, findings are similar to prior chest CT and likely due to recurrent aspiration. Complete opacification of the left hemithorax, likely due to a combination of atelectasis and airspace consolidation. 2. Irregular solid nodular opacity of the right upper is unchanged in size when compared with prior exam and concerning for primary lung malignancy. Recommend further evaluation with PET-CT. 3. Nodular liver contour which is suggestive of cirrhosis. 4. Small right pleural effusion and right basilar atelectasis. 5. Aortic Atherosclerosis (ICD10-I70.0). Electronically Signed   By: Yetta Glassman M.D.   On: 05/12/2022 15:29   DG Chest Port 1 View  Result Date: 05/11/2022 CLINICAL DATA:  Shortness of breath EXAM: PORTABLE CHEST 1 VIEW COMPARISON:  02/07/2022 FINDINGS: Similar enlarged cardiomediastinal contours. Left greater than right bibasilar opacities with probable small bilateral pleural effusions. 1.5 cm rounded nodular density at the medial aspect of the right lung base. No pneumothorax. Chronic deformity of the left second rib. IMPRESSION: 1. Left greater than right bibasilar opacities with probable small bilateral pleural effusions. 2. 1.5 cm rounded nodular density at the medial  aspect of the right lung base. Electronically Signed   By: Davina Poke D.O.   On: 05/11/2022 08:34     Disposition: home w/ home hospice  Discharge Condition: stable   Discharge Exam:  Vitals:   05/16/22 0807 05/16/22 1037  BP:  105/73  Pulse:  100  Resp:    Temp:    SpO2: 95%    General: NAD, pleasant Cardiac: Irregularly irregular Respiratory: CTAB, normal WOB on supplemental O2 Abdomen: soft, non-tender, non-distended Extremities: trace b/l lower extremity edema   Significant Procedures: n/a  Significant Labs and Imaging:     Latest Ref Rng & Units 05/14/2022   12:32 AM 05/13/2022   12:24 AM 05/12/2022    1:35 AM  CBC  WBC 4.0 - 10.5 K/uL 11.2  13.0  14.3   Hemoglobin 13.0 - 17.0 g/dL 13.7  13.7  13.8   Hematocrit 39.0 - 52.0 % 44.3  44.4  42.4   Platelets 150 - 400 K/uL 258  253  243       Latest Ref Rng & Units 05/14/2022   12:32 AM 05/13/2022   12:24 AM 05/12/2022    1:35 AM  CMP  Glucose 70 - 99 mg/dL 87  166  112   BUN 8 - 23 mg/dL '18  19  18   '$ Creatinine 0.61 - 1.24 mg/dL 0.74  0.81  0.63   Sodium 135 - 145 mmol/L 140  139  138   Potassium 3.5 - 5.1 mmol/L 3.6  4.2  4.1   Chloride 98 - 111 mmol/L 90  90  95   CO2 22 - 32 mmol/L 42  37  36   Calcium 8.9 - 10.3 mg/dL 8.6  8.7  8.8      Results/Tests Pending at Time of Discharge: n/a  Discharge Medications:  Allergies as of 05/16/2022   No Known Allergies      Medication List     STOP taking these medications    acetaminophen 325 MG tablet Commonly known as: TYLENOL   atorvastatin 80 MG tablet Commonly known as: LIPITOR   Eliquis 5 MG Tabs tablet Generic drug: apixaban   insulin aspart 100 UNIT/ML FlexPen Commonly known as: NOVOLOG   metoprolol tartrate 25 MG tablet Commonly known as: LOPRESSOR   potassium chloride SA 20 MEQ tablet Commonly known as: KLOR-CON M       TAKE these medications    furosemide 20 MG tablet Commonly known as: LASIX Take 1 tablet (20 mg total) by  mouth 2 (two) times daily as needed. What changed:  when to take this reasons to take this   gabapentin 600 MG tablet Commonly known as: NEURONTIN Take 600 mg by mouth at bedtime.   insulin glargine 100 UNIT/ML Solostar Pen Commonly known as: LANTUS Inject 15 Units into the skin at bedtime. What changed: how much to take   LORazepam 0.5 MG tablet Commonly known as: ATIVAN Take 1-2 tablets (0.5-1 mg total) by mouth every 6 (six) hours as needed for anxiety or sleep.   morphine CONCENTRATE 10 MG/0.5ML Soln concentrated solution Place 0.25 mLs (5 mg total) under the tongue every 3 (three) hours as needed for severe pain, moderate pain or shortness of breath.        Discharge Instructions: Please refer to Patient Instructions section of EMR for full details.  Patient was counseled important signs and symptoms that should prompt return to medical care, changes in medications, dietary instructions, activity restrictions, and follow up appointments.   Follow-Up Appointments:   [name], MD 05/16/2022, 12:29 PM PGY-1, [place]

## 2022-05-16 NOTE — Discharge Instructions (Signed)
Dear [name],  Thank you for letting us participate in your care. You were hospitalized for Acute exacerbation of CHF (congestive heart failure) ([place]). You were treated with IV antibiotics and diuretics.    POST-HOSPITAL & CARE INSTRUCTIONS Please review your medication list for any updates.  DOCTOR'S APPOINTMENT   No future appointments.   Take care and be well!  [facility]  [place],  [phone]

## 2022-05-16 NOTE — Progress Notes (Signed)
Palliative Medicine Progress Note   Patient Name: Jordan Wells       Date: 05/16/2022 DOB: Oct 31, 1945  Age: 77 y.o. MRN#: [id number] Attending Physician: No att. providers found Primary Care Physician: Delorise Shiner, MD Admit Date: 05/11/2022   HPI/Patient Profile: 77 y.o. male  with past medical history of COPD, HFpEF, CAD s/p MI, T2DM, afib on Eliquis, and non-Hodgkin lymphoma s/p splenectomy who presented to the ED on 05/11/2022 with shortness of breath. He was admitted to [facility] Medicine service with acute heart failure exacerbation and possible CAP. He was also found to have R upper lung nodule, with concern for malignancy.    Palliative Medicine has been consulted for goals of care.   Subjective: Patient is resting comfortably in bed Currently on 6L oxygen.  Son Jeneen Rinks is at bedside. They are having an oxygen concentrator delivered today. Once delivery confirmed, transport will be arranged.   I spoke with Cheri RN liaison with [facility] [place]. She reports that admission visit will be on Thursday 2/1 per request of other son Carlon Chaloux.  Patient will therefore sent to have Rx for comfort meds to last several days.   Plan of care discussed with Family Medicine service.    Objective:  Physical Exam Vitals reviewed.  Constitutional:      General: He is sleeping. He is not in acute distress.    Appearance: He is obese. He is ill-appearing.  Pulmonary:     Effort: Pulmonary effort is normal.             Palliative Medicine Assessment & Plan   Assessment: Principal Problem:   Acute exacerbation of CHF (congestive heart failure) ([facility]) Active Problems:   Pneumonia due to infectious organism   Type 2 diabetes mellitus with hyperglycemia, with long-term current use of insulin  ([facility])   CAD (coronary artery disease)   Chronic atrial fibrillation ([facility])   COPD (chronic obstructive pulmonary disease) ([facility])   Acute and chronic respiratory failure with hypoxia ([facility])   Lung nodule   Intertrigo   Multiple open wounds of left lower extremity   Lung consolidation ([facility])   Goals of care, counseling/discussion   Pulmonary infiltrate    Recommendations/Plan: Pending discharge home with hospice today Please provide Rx for comfort meds: Morphine solution 5 mg every 3 hours as needed for  pain or dyspnea Ativan 0.5-1 mg every 6 hours as needed for anxiety or sleep   Code Status: DNR - gold form is in discharge packet   Prognosis:  < 6 months    Thank you for allowing the Palliative Medicine Team to assist in the care of this patient.   Lavena Bullion, NP   Please contact Palliative Medicine Team phone at [phone] for questions and concerns.  For individual providers, please see AMION.

## 2022-05-16 NOTE — TOC Transition Note (Addendum)
Transition of Care [facility]) - CM/SW Discharge Note   Patient Details  Name: Jordan Wells MRN: [id number] Date of Birth: 06/20/1945  Transition of [facility]) CM/SW Contact:  Zenon Mayo, RN Phone Number: 05/16/2022, 8:35 AM   Clinical Narrative:    Patient's physical address is [address], [place] [address] , confirmed by Laverna Peace.  Laverna Peace states he just spoke with Cheri with [facility] and and they will be sending a 10 liter concentrator to patient's home today.  They will let NCM know when the concentrator has been delivered.  Will need DNR to be signed on chart as well. PTAR packet on unit. Cheri will call PTAR when concentrator has been delivered.  Staff RN aware. Laverna Peace states he is with patient 24 hrs/day.  PTAR has been called, they will be here in 1 to 2 hours.    Final next level of care: Home w Hospice Care Barriers to Discharge: No Barriers Identified   Patient Goals and CMS Choice CMS Medicare.gov Compare Post Acute Care list provided to:: Patient Represenative (must comment) Choice offered to / list presented to : Adult Children  Discharge Placement                         Discharge Plan and Services Additional resources added to the After Visit Summary for                  DME Arranged:  (Hospice supplies the DME) DME Agency: [employer]       HH Arranged: RN [place] Agency: [place] Date [place]: 05/15/22 Time [place]: 43 Representative spoke with at [place]: [place] Determinants of Health ([place]) Interventions SDOH Screenings   Food Insecurity: No Food Insecurity (05/13/2022)  Housing: Low Risk  (05/13/2022)  Transportation Needs: No Transportation Needs (05/13/2022)  Utilities: Not At Risk (05/13/2022)  Tobacco Use: Low Risk  (05/11/2022)     Readmission Risk Interventions     No data to display

## 2022-05-16 NOTE — TOC Progression Note (Addendum)
Transition of Care [facility]) - Progression Note    Patient Details  Name: Jordan Wells MRN: [id number] Date of Birth: [birth year]  Transition of Care [facility]) CM/SW Contact  Zenon Mayo, RN Phone Number: 05/16/2022, 8:14 AM  Clinical Narrative:    Patient's physical address is [address], [place] [address] , confirmed by Laverna Peace.  Laverna Peace states he just spoke with Cheri with [facility] and and they will be sending a 10 liter concentrator to patient's home today.  They will let NCM know when the concentrator has been delivered.  Will need DNR to be signed on chart as well. PTAR packet on unit. Cheri will call PTAR when concentrator has been delivered.      Barriers to Discharge: Continued Medical Work up  Expected Discharge Plan and Services                         DME Arranged:  (Hospice will supply the DME)         HH Arranged: RN [facility] Agency: [place] Date [place]: 05/15/22 Time [place]: 69 Representative spoke with at [place]: [place] Determinants of Health ([place]) Interventions SDOH Screenings   Food Insecurity: No Food Insecurity (05/13/2022)  Housing: Low Risk  (05/13/2022)  Transportation Needs: No Transportation Needs (05/13/2022)  Utilities: Not At Risk (05/13/2022)  Tobacco Use: Low Risk  (05/11/2022)    Readmission Risk Interventions     No data to display

## 2022-05-16 NOTE — Plan of Care (Addendum)
PT discharging to home on Hospice.   23 Called son to let him know PT on route and arriving in approximately 30 mins.    Problem: Education: Goal: Ability to demonstrate management of disease process will improve Outcome: Adequate for Discharge Goal: Ability to verbalize understanding of medication therapies will improve Outcome: Adequate for Discharge Goal: Individualized Educational Video(s) Outcome: Adequate for Discharge   Problem: Activity: Goal: Capacity to carry out activities will improve Outcome: Adequate for Discharge   Problem: Cardiac: Goal: Ability to achieve and maintain adequate cardiopulmonary perfusion will improve Outcome: Adequate for Discharge   Problem: Education: Goal: Ability to describe self-care measures that may prevent or decrease complications (Diabetes Survival Skills Education) will improve Outcome: Adequate for Discharge Goal: Individualized Educational Video(s) Outcome: Adequate for Discharge   Problem: Coping: Goal: Ability to adjust to condition or change in health will improve Outcome: Adequate for Discharge   Problem: Fluid Volume: Goal: Ability to maintain a balanced intake and output will improve Outcome: Adequate for Discharge   Problem: Health Behavior/Discharge Planning: Goal: Ability to identify and utilize available resources and services will improve Outcome: Adequate for Discharge Goal: Ability to manage health-related needs will improve Outcome: Adequate for Discharge   Problem: Metabolic: Goal: Ability to maintain appropriate glucose levels will improve Outcome: Adequate for Discharge   Problem: Nutritional: Goal: Maintenance of adequate nutrition will improve Outcome: Adequate for Discharge Goal: Progress toward achieving an optimal weight will improve Outcome: Adequate for Discharge   Problem: Skin Integrity: Goal: Risk for impaired skin integrity will decrease Outcome: Adequate for Discharge   Problem: Tissue  Perfusion: Goal: Adequacy of tissue perfusion will improve Outcome: Adequate for Discharge   Problem: Education: Goal: Knowledge of General Education information will improve Description: Including pain rating scale, medication(s)/side effects and non-pharmacologic comfort measures Outcome: Adequate for Discharge   Problem: Health Behavior/Discharge Planning: Goal: Ability to manage health-related needs will improve Outcome: Adequate for Discharge   Problem: Clinical Measurements: Goal: Ability to maintain clinical measurements within normal limits will improve Outcome: Adequate for Discharge Goal: Will remain free from infection Outcome: Adequate for Discharge Goal: Diagnostic test results will improve Outcome: Adequate for Discharge Goal: Respiratory complications will improve Outcome: Adequate for Discharge Goal: Cardiovascular complication will be avoided Outcome: Adequate for Discharge   Problem: Activity: Goal: Risk for activity intolerance will decrease Outcome: Adequate for Discharge   Problem: Nutrition: Goal: Adequate nutrition will be maintained Outcome: Adequate for Discharge   Problem: Coping: Goal: Level of anxiety will decrease Outcome: Adequate for Discharge   Problem: Elimination: Goal: Will not experience complications related to bowel motility Outcome: Adequate for Discharge Goal: Will not experience complications related to urinary retention Outcome: Adequate for Discharge   Problem: Pain Managment: Goal: General experience of comfort will improve Outcome: Adequate for Discharge   Problem: Safety: Goal: Ability to remain free from injury will improve Outcome: Adequate for Discharge   Problem: Skin Integrity: Goal: Risk for impaired skin integrity will decrease Outcome: Adequate for Discharge

## 2022-05-17 ENCOUNTER — Telehealth: Payer: Self-pay | Admitting: Pulmonary Disease

## 2022-05-17 ENCOUNTER — Other Ambulatory Visit: Payer: Self-pay | Admitting: Family Medicine

## 2022-05-17 DIAGNOSIS — R911 Solitary pulmonary nodule: Secondary | ICD-10-CM

## 2022-05-17 MED ORDER — MORPHINE SULFATE (CONCENTRATE) 10 MG/0.5ML PO SOLN: 5.0000 mg | ORAL | 0 refills | Status: AC | PRN

## 2022-05-17 NOTE — Telephone Encounter (Signed)
CT scan has been done. But patient will need to be scheduled for a new patient appointment he only saw Dr Lake Bells in the hospital.   Thank you

## 2022-05-18 NOTE — Telephone Encounter (Signed)
Attempted to call patient at both numbers. One did not work and the other rang busy. Will attempt to call later.

## 2022-05-26 NOTE — Telephone Encounter (Signed)
ATC 2 times- no response. Letter mailed to address on file. Closing encounter

## 2022-06-16 DEATH — deceased
